# Patient Record
Sex: Female | Born: 1990 | Race: White | Hispanic: No | Marital: Single | Smoking: Never smoker
Health system: Southern US, Community
[De-identification: ages and names within clinical notes are randomized; demographics above are authoritative.]

## PROBLEM LIST (undated history)

## (undated) DIAGNOSIS — R6889 Other general symptoms and signs: Secondary | ICD-10-CM

## (undated) DIAGNOSIS — R300 Dysuria: Secondary | ICD-10-CM

## (undated) DIAGNOSIS — R102 Pelvic and perineal pain: Secondary | ICD-10-CM

## (undated) DIAGNOSIS — Z87440 Personal history of urinary (tract) infections: Secondary | ICD-10-CM

## (undated) HISTORY — DX: Other general symptoms and signs: R68.89

---
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
## 2010-04-27 LAB — POCT HEMOGLOBIN-HEMACUE: Hemoglobin: 13.9 g/dL (ref 12.0–15.0)

## 2010-09-25 ENCOUNTER — Ambulatory Visit: Payer: Self-pay | Admitting: Family

## 2010-09-30 ENCOUNTER — Ambulatory Visit (INDEPENDENT_AMBULATORY_CARE_PROVIDER_SITE_OTHER): Payer: 59 | Admitting: Family

## 2010-09-30 ENCOUNTER — Telehealth: Payer: Self-pay | Admitting: Family

## 2010-09-30 ENCOUNTER — Encounter: Payer: Self-pay | Admitting: Family

## 2010-09-30 DIAGNOSIS — R5383 Other fatigue: Secondary | ICD-10-CM | POA: Insufficient documentation

## 2010-09-30 DIAGNOSIS — R5381 Other malaise: Secondary | ICD-10-CM

## 2010-09-30 DIAGNOSIS — R635 Abnormal weight gain: Secondary | ICD-10-CM

## 2010-09-30 DIAGNOSIS — E039 Hypothyroidism, unspecified: Secondary | ICD-10-CM

## 2010-09-30 DIAGNOSIS — R41840 Attention and concentration deficit: Secondary | ICD-10-CM

## 2010-09-30 DIAGNOSIS — B079 Viral wart, unspecified: Secondary | ICD-10-CM

## 2010-09-30 LAB — CBC WITH DIFFERENTIAL/PLATELET
Basophils Absolute: 0 10*3/uL (ref 0.0–0.1)
Basophils Relative: 0 % (ref 0–1)
Eosinophils Absolute: 0.2 10*3/uL (ref 0.0–0.7)
Eosinophils Relative: 2 % (ref 0–5)
HCT: 39.1 % (ref 36.0–46.0)
Hemoglobin: 13.4 g/dL (ref 12.0–15.0)
Lymphocytes Relative: 34 % (ref 12–46)
Lymphs Abs: 2.6 10*3/uL (ref 0.7–4.0)
MCH: 29.9 pg (ref 26.0–34.0)
MCHC: 34.3 g/dL (ref 30.0–36.0)
MCV: 87.3 fL (ref 78.0–100.0)
Monocytes Absolute: 0.4 10*3/uL (ref 0.1–1.0)
Monocytes Relative: 6 % (ref 3–12)
Neutro Abs: 4.5 10*3/uL (ref 1.7–7.7)
Neutrophils Relative %: 58 % (ref 43–77)
Platelets: 224 10*3/uL (ref 150–400)
RBC: 4.48 MIL/uL (ref 3.87–5.11)
RDW: 12.6 % (ref 11.5–15.5)
WBC: 7.6 10*3/uL (ref 4.0–10.5)

## 2010-09-30 LAB — TSH: TSH: 1.551 u[IU]/mL (ref 0.350–4.500)

## 2010-09-30 NOTE — Patient Instructions (Signed)
You will be contacted about your referral for ADD evaluation. We will contact you with your blood results.  Follow up in 1-2 months.

## 2010-09-30 NOTE — Telephone Encounter (Signed)
When you enter the referral for adhd testing mother called back to state, please check with [facility] and see if the testing could be done at [facility] versus [address]

## 2010-09-30 NOTE — Assessment & Plan Note (Signed)
They wish to have formal ADD evaluation.  Will refer to psychology.

## 2010-09-30 NOTE — Assessment & Plan Note (Signed)
20 yr old female with fatigue and weight gain. Will check TSH and CBC.

## 2010-09-30 NOTE — Progress Notes (Signed)
  Subjective:    Patient ID: Tammie Boone, female    DOB: March 24, 1990, 20 y.o.   MRN: [id number]  HPI  Tammie Boone is a 20 yr old female who presents today for several concerns.   1) Poor concentration- [place] [employer]en- She has never been formally tested for ADD, although her mother notes that they have suspected it for some time. Since going to college she is having more trouble focusing.  Grades last semester were A's and B's.  Freshman year she had some C's and D's.    2)Weight gain/Fatigue,-   She has gained 20 pounds in the last 12-18 months. + Dizziness/headaches.  Has trouble falling asleep and trouble staying asleep.  On emotional roller coaster.  Periods are regular.  She does get bad cramping.  She reports that she exercises about 5 days a week.  Watches her diet carefully.    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  3) She has a wart on her left forearm that she would like to have frozen.    Review of Systems See HPI  No past medical history on file.  History   Social History  . Marital Status: Single    Spouse Name: N/A    Number of Children: N/A  . Years of Education: N/A   Occupational History  . Not on file.   Social History Main Topics  . Smoking status: Never Smoker   . Smokeless tobacco: Never Used  . Alcohol Use: Not on file  . Drug Use: Not on file  . Sexually Active: Not on file   Other Topics Concern  . Not on file   Social History Narrative  . No narrative on file    No past surgical history on file.  No family history on file.  Allergies  Allergen Reactions  . Sulfonamide Derivatives     No current outpatient prescriptions on file prior to visit.    BP 116/78  Pulse 78  Temp(Src) 97.8 F (36.6 C) (Oral)  Resp 16  Ht 5' 7.99" (1.727 m)  Wt 175 lb 1.3 oz (79.416 kg)  BMI 26.63 kg/m2  LMP 09/09/2010       Objective:   Physical Exam  Constitutional: She appears well-developed and well-nourished.    HENT:  Head: Normocephalic and atraumatic.  Neck: Normal range of motion. No thyromegaly present.  Cardiovascular: Normal rate and regular rhythm.   Pulmonary/Chest: Effort normal and breath sounds normal.  Skin: Skin is warm and dry.       Raised wart noted on left forearm which is about 3mm wide.  Psychiatric: She has a normal mood and affect. Her behavior is normal. Thought content normal.          Assessment & Plan:

## 2010-09-30 NOTE — Assessment & Plan Note (Signed)
Wart on left forearm.  Wart was frozen today using liquid nitrogen.  Pt instructed to keep the area clean and dry.

## 2010-10-01 ENCOUNTER — Encounter: Payer: Self-pay | Admitting: Family

## 2010-10-02 ENCOUNTER — Ambulatory Visit: Payer: Self-pay | Admitting: Family

## 2010-10-07 ENCOUNTER — Ambulatory Visit: Payer: 59 | Admitting: Licensed Clinical Social Worker

## 2010-10-15 ENCOUNTER — Ambulatory Visit (INDEPENDENT_AMBULATORY_CARE_PROVIDER_SITE_OTHER): Payer: 59 | Admitting: Licensed Clinical Social Worker

## 2010-10-15 DIAGNOSIS — F4321 Adjustment disorder with depressed mood: Secondary | ICD-10-CM

## 2010-10-23 ENCOUNTER — Ambulatory Visit (INDEPENDENT_AMBULATORY_CARE_PROVIDER_SITE_OTHER): Payer: 59 | Admitting: Licensed Clinical Social Worker

## 2010-10-23 DIAGNOSIS — F4321 Adjustment disorder with depressed mood: Secondary | ICD-10-CM

## 2010-10-28 ENCOUNTER — Ambulatory Visit: Payer: Self-pay | Admitting: Family

## 2010-10-30 ENCOUNTER — Encounter: Payer: Self-pay | Admitting: Family

## 2010-10-30 ENCOUNTER — Ambulatory Visit (INDEPENDENT_AMBULATORY_CARE_PROVIDER_SITE_OTHER): Payer: 59 | Admitting: Family

## 2010-10-30 VITALS — BP 98/68 | HR 72 | Temp 97.8°F | Resp 16 | Ht 67.0 in | Wt 177.0 lb

## 2010-10-30 DIAGNOSIS — F988 Other specified behavioral and emotional disorders with onset usually occurring in childhood and adolescence: Secondary | ICD-10-CM | POA: Insufficient documentation

## 2010-10-30 DIAGNOSIS — B079 Viral wart, unspecified: Secondary | ICD-10-CM

## 2010-10-30 DIAGNOSIS — Z23 Encounter for immunization: Secondary | ICD-10-CM

## 2010-10-30 MED ORDER — AMPHETAMINE-DEXTROAMPHET ER 20 MG PO CP24: 20.0000 mg | ORAL_CAPSULE | ORAL | Status: DC

## 2010-10-30 NOTE — Assessment & Plan Note (Signed)
Wart was frozen with liquid nitrogen today.

## 2010-10-30 NOTE — Assessment & Plan Note (Signed)
Will give pt a trial of Adderall today.  We discussed common side effects as well as the fact that this is a controled substance.  A controlled substance contract was signed with the patient today.  She is instructed to follow up in 1 month.

## 2010-10-30 NOTE — Progress Notes (Signed)
  Subjective:    Patient ID: Tammie Boone, female    DOB: 12-Jul-1990, 20 y.o.   MRN: [id number]  HPI Tammie Boone is a 20 yr old female who presents today for follow up.    1) ADD-  She underwent a formal ADD evalution at [facility] with Judithe Modest.  Records are reviewed and are suggestive of ADD and possible diagnosis of mild cognitive impairment. She reports ongoing trouble with her studying. She has been working hard in school and has been managing to earn B's in most of her classes. She is interested in medical therapy for her ADD.  2) Wart- (left forearm) This was frozen last visit.  Notes that it is flatter today, but not resolved.    Review of Systems See HPI  No past medical history on file.  History   Social History  . Marital Status: Single    Spouse Name: N/A    Number of Children: N/A  . Years of Education: N/A   Occupational History  . Not on file.   Social History Main Topics  . Smoking status: Never Smoker   . Smokeless tobacco: Never Used  . Alcohol Use: Not on file  . Drug Use: Not on file  . Sexually Active: Not on file   Other Topics Concern  . Not on file   Social History Narrative  . No narrative on file    No past surgical history on file.  No family history on file.  Allergies  Allergen Reactions  . Sulfonamide Derivatives     No current outpatient prescriptions on file prior to visit.    BP 98/68  Pulse 72  Temp(Src) 97.8 F (36.6 C) (Oral)  Resp 16  Ht 5\' 7"  (1.702 m)  Wt 177 lb (80.287 kg)  BMI 27.72 kg/m2       Objective:   Physical Exam  Constitutional: She appears well-developed and well-nourished. No distress.  Skin:       Wart left forearm is slightly flatter than last visit.   Psychiatric: She has a normal mood and affect. Her behavior is normal. Judgment and thought content normal.          Assessment & Plan:

## 2010-10-30 NOTE — Patient Instructions (Signed)
Please follow up in 1 month, sooner if problems or concerns.

## 2010-11-13 ENCOUNTER — Telehealth: Payer: Self-pay | Admitting: *Deleted

## 2010-11-13 NOTE — Telephone Encounter (Signed)
Please let pt know that per controlled substance contract we are unable to refill before 30 days.  I would recommend that she verify the quantity at the pharmacy next time prior to leaving to make sure that they have dispensed the proper amount.  She should also make sure that she secures her medication to make sure that nobody is taking her medication.

## 2010-11-13 NOTE — Telephone Encounter (Signed)
Pt left message stating she has been taking generic adderall x 2 weeks. Noticed this morning that she only has 3 pills left in her bottle. She called pharmacy and they told her they filled rx for a quantity of 30 and advised her to contact us. Please advise.

## 2010-11-13 NOTE — Telephone Encounter (Signed)
Pt notified and voices understanding. 

## 2010-12-02 ENCOUNTER — Ambulatory Visit (INDEPENDENT_AMBULATORY_CARE_PROVIDER_SITE_OTHER): Payer: 59 | Admitting: Family

## 2010-12-02 ENCOUNTER — Encounter: Payer: Self-pay | Admitting: Family

## 2010-12-02 DIAGNOSIS — F988 Other specified behavioral and emotional disorders with onset usually occurring in childhood and adolescence: Secondary | ICD-10-CM

## 2010-12-02 DIAGNOSIS — B079 Viral wart, unspecified: Secondary | ICD-10-CM

## 2010-12-02 MED ORDER — AMPHETAMINE-DEXTROAMPHET ER 20 MG PO CP24: 20.0000 mg | ORAL_CAPSULE | ORAL | Status: DC

## 2010-12-02 NOTE — Progress Notes (Signed)
  Subjective:    Patient ID: Tammie Boone, female    DOB: July 06, 1990, 20 y.o.   MRN: [id number]  HPI  Ms.  Stiver is a 20 yr old female who presents today for follow up of her ADD.  Last visit she was started on Adderall xr for her newly diagnosed ADD.  She was only able to complete 14 days of her adderall due to missing pills.  She was not sure if they were stolen or if the pharmacy did not dispense the proper amount.  She felt that the Adderall was helping her.  Last dose was last week.  She did not have associated side effects.    Wart- She is requesting that the wart be retreated.    Review of Systems    see HPI  No past medical history on file.  History   Social History  . Marital Status: Single    Spouse Name: N/A    Number of Children: N/A  . Years of Education: N/A   Occupational History  . Not on file.   Social History Main Topics  . Smoking status: Never Smoker   . Smokeless tobacco: Never Used  . Alcohol Use: Not on file  . Drug Use: Not on file  . Sexually Active: Not on file   Other Topics Concern  . Not on file   Social History Narrative  . No narrative on file    No past surgical history on file.  No family history on file.  Allergies  Allergen Reactions  . Sulfonamide Derivatives     No current outpatient prescriptions on file prior to visit.    BP 118/84  Pulse 66  Temp(Src) 97.8 F (36.6 C) (Oral)  Resp 16  Ht 5\' 7"  (1.702 m)  Wt 178 lb 1.3 oz (80.777 kg)  BMI 27.89 kg/m2  LMP 11/20/2010    Objective:   Physical Exam  Constitutional: She appears well-developed and well-nourished.  Skin:       Wart left forearm is slightly smaller in size.   Psychiatric: She has a normal mood and affect. Her behavior is normal. Judgment and thought content normal.          Assessment & Plan:

## 2010-12-02 NOTE — Assessment & Plan Note (Signed)
Improved while she was on the Adderall.  I reinforced with the patient the importance of keeping her medications secured.

## 2010-12-02 NOTE — Patient Instructions (Signed)
Follow up in 3 months, sooner if problems or concerns.   

## 2010-12-24 NOTE — Progress Notes (Signed)
  Subjective:    Patient ID: Tammie Boone, female    DOB: 30-Apr-1990, 20 y.o.   MRN: [id number]  HPI    Review of Systems     Objective:   Physical Exam        Assessment & Plan:  Tammie Boone was frozen on left arm using liquid nitrogen.  Pt tolerated procedure without difficulty.

## 2011-01-01 ENCOUNTER — Telehealth: Payer: Self-pay | Admitting: Family

## 2011-01-01 MED ORDER — AMPHETAMINE-DEXTROAMPHET ER 20 MG PO CP24: 20.0000 mg | ORAL_CAPSULE | ORAL | Status: DC

## 2011-01-01 NOTE — Telephone Encounter (Signed)
Message left on Amy's voicemail that Rx is at front desk for pick up and to call if any questions.

## 2011-01-01 NOTE — Telephone Encounter (Signed)
Rx printed and forwarded to Provider for signature. 

## 2011-01-01 NOTE — Telephone Encounter (Signed)
Patients mom, Amy called asking for a refill of Adderrall for patient. Amy states that she would like to come by this afternoon to pickup rx.

## 2011-01-01 NOTE — Telephone Encounter (Signed)
Rx signed.

## 2011-01-27 ENCOUNTER — Telehealth: Payer: Self-pay | Admitting: Family

## 2011-01-27 MED ORDER — AMPHETAMINE-DEXTROAMPHET ER 20 MG PO CP24: 20.0000 mg | ORAL_CAPSULE | ORAL | Status: DC

## 2011-01-27 NOTE — Telephone Encounter (Signed)
Rx printed and forwarded to Provider for signature. 

## 2011-01-27 NOTE — Telephone Encounter (Signed)
Patients mom, Amy states that patient needs a refill on adderall. Patient is going back to school this weekend and would like to pick up rx on Friday.

## 2011-01-29 NOTE — Telephone Encounter (Signed)
Left detailed message on Ann's voicemail that rx has been left at front desk for pick up and to call if any questions.

## 2011-02-02 ENCOUNTER — Encounter: Payer: Self-pay | Admitting: Gastroenterology

## 2011-02-16 ENCOUNTER — Encounter: Payer: Self-pay | Admitting: Gastroenterology

## 2011-02-16 ENCOUNTER — Ambulatory Visit (INDEPENDENT_AMBULATORY_CARE_PROVIDER_SITE_OTHER): Payer: 59 | Admitting: Gastroenterology

## 2011-02-16 VITALS — BP 104/70 | HR 70 | Ht 68.0 in | Wt 170.2 lb

## 2011-02-16 DIAGNOSIS — K921 Melena: Secondary | ICD-10-CM

## 2011-02-16 DIAGNOSIS — K5900 Constipation, unspecified: Secondary | ICD-10-CM

## 2011-02-16 MED ORDER — PEG-KCL-NACL-NASULF-NA ASC-C 100 G PO SOLR: 1.0000 | Freq: Once | ORAL | Status: DC

## 2011-02-16 NOTE — Patient Instructions (Addendum)
You have been scheduled for a Colonoscopy with propofol. See separate instructions.  Pick up your prep kit from your pharmacy.  Use Preparation H suppositories every day for rectal bleeding.

## 2011-02-16 NOTE — Progress Notes (Signed)
History of Present Illness: This is a 21 year old female here today with her mother. She has a long history of mild constipation which is controlled with MiraLax as needed. She developed bright red blood per rectum,noted on the toilet paper, several weeks ago associated with bowel movements. She has had several episodes but none in the past 2-3 days. Her father was diagnosed with stage IV colon cancer age 46 and passed away a short time later. No other family members with colon cancer. Denies weight loss, abdominal pain, constipation, diarrhea, change in stool caliber, melena, hematochezia, nausea, vomiting, dysphagia, reflux symptoms, chest pain..   Review of Systems: Pertinent positive and negative review of systems were noted in the above HPI section. All other review of systems were otherwise negative.  Current Medications, Allergies, Past Medical History, Past Surgical History, Family History and Social History were reviewed in [employer] record.  Physical Exam: General: Well developed , well nourished, no acute distress Head: Normocephalic and atraumatic Eyes:  sclerae anicteric, EOMI Ears: Normal auditory acuity Mouth: No deformity or lesions Neck: Supple, no masses or thyromegaly Lungs: Clear throughout to auscultation Heart: Regular rate and rhythm; no murmurs, rubs or bruits Abdomen: Soft, non tender and non distended. No masses, hepatosplenomegaly or hernias noted. Normal Bowel sounds Rectal: No lesions, no tenderness, Hemoccult-negative stool in the vault  Musculoskeletal: Symmetrical with no gross deformities  Skin: No lesions on visible extremities Pulses:  Normal pulses noted Extremities: No clubbing, cyanosis, edema or deformities noted Neurological: Alert oriented x 4, grossly nonfocal Cervical Nodes:  No significant cervical adenopathy Inguinal Nodes: No significant inguinal adenopathy Psychological:  Alert and cooperative. Normal mood and  affect  Assessment and Recommendations:  1. Small volume hematochezia and a family history of colon cancer in her father at age 51. I suspect she has internal hemorrhoids or another benign source of anorectal bleeding however colorectal neoplasms need to be excluded. The risks, benefits, and alternatives to colonoscopy with possible biopsy and possible polypectomy were discussed with the patient and they consent to proceed. Preparation H suppository daily for recurrent bleeding and further recommendations based on findings at colonoscopy.   2. Mild chronic constipation. Increase dietary fiber and water intake. MiraLax daily as needed.

## 2011-02-26 ENCOUNTER — Encounter: Payer: Self-pay | Admitting: Gastroenterology

## 2011-02-26 ENCOUNTER — Ambulatory Visit (AMBULATORY_SURGERY_CENTER): Payer: 59 | Admitting: Gastroenterology

## 2011-02-26 VITALS — BP 97/44 | HR 78 | Temp 98.0°F | Resp 15 | Ht 68.0 in | Wt 170.0 lb

## 2011-02-26 DIAGNOSIS — Z800 Family history of malignant neoplasm of digestive organs: Secondary | ICD-10-CM

## 2011-02-26 DIAGNOSIS — K921 Melena: Secondary | ICD-10-CM

## 2011-02-26 MED ORDER — SODIUM CHLORIDE 0.9 % IV SOLN: 500.0000 mL | INTRAVENOUS | Status: DC

## 2011-02-26 NOTE — Patient Instructions (Addendum)
FOLLOW DISCHARGE INSTRUCTIONS (BLUE & GREEN SHEETS). FOR DIET & SAFETY PRECAUTIONS.

## 2011-02-26 NOTE — Progress Notes (Signed)
Patient did not experience any of the following events: a burn prior to discharge; a fall within the facility; wrong site/side/patient/procedure/implant event; or a hospital transfer or hospital admission upon discharge from the facility. (G8907) Patient did not have preoperative order for IV antibiotic SSI prophylaxis. (G8918)  

## 2011-02-26 NOTE — Op Note (Signed)
[facility] [address] [place], [place]  [address]  COLONOSCOPY PROCEDURE REPORT  PATIENT:  Tammie, Boone  MR#:  [id number] BIRTHDATE:  1990-11-28, 20 yrs. old  GENDER:  female ENDOSCOPIST:  Judie Petit T. Russella Dar, MD, [facility] Referred by:  Alma DownsLendell Caprice, N.P. PROCEDURE DATE:  02/26/2011 PROCEDURE:  Colonoscopy 95621 ASA CLASS:  Class I INDICATIONS:  1) hematochezia  2) constipation  3) family history of colon cancer: father age 15. MEDICATIONS:   MAC sedation, administered by CRNA, propofol (Diprivan) 300 mg IV DESCRIPTION OF PROCEDURE:   After the risks benefits and alternatives of the procedure were thoroughly explained, informed consent was obtained.  Digital rectal exam was performed and revealed no abnormalities.   The LB 180AL [id number] endoscope was introduced through the anus and advanced to the cecum, which was identified by both the appendix and ileocecal valve, without limitations.  The quality of the prep was good, using MoviPrep. The instrument was then slowly withdrawn as the colon was fully examined. <<PROCEDUREIMAGES>> FINDINGS:  A normal appearing cecum, ileocecal valve, and appendiceal orifice were identified. The ascending, hepatic flexure, transverse, splenic flexure, descending, sigmoid colon, and rectum appeared unremarkable.  Retroflexed views in the rectum revealed no abnormalities.  The time to cecum =  3.5  minutes. The scope was then withdrawn (time =  11.67  min) from the patient and the procedure completed.  COMPLICATIONS:  None  ENDOSCOPIC IMPRESSION: 1) Normal colon  RECOMMENDATIONS: 1) Repeat Colonoscopy in 5 years.  Venita Lick. Russella Dar, MD, [facility]  n. eSIGNED:   Venita Lick. Endora Teresi at 02/26/2011 03:28 PM  Kathe Mariner, [phone]

## 2011-03-01 ENCOUNTER — Telehealth: Payer: Self-pay | Admitting: *Deleted

## 2011-03-01 NOTE — Telephone Encounter (Signed)
  Follow up Call-  Call back number 02/26/2011  Post procedure Call Back phone  # [phone]  Permission to leave phone message Yes      No answer,left message.

## 2011-03-04 ENCOUNTER — Telehealth: Payer: Self-pay | Admitting: Family

## 2011-03-04 MED ORDER — AMPHETAMINE-DEXTROAMPHET ER 20 MG PO CP24: 20.0000 mg | ORAL_CAPSULE | ORAL | Status: DC

## 2011-03-04 NOTE — Telephone Encounter (Signed)
Pt last seen in November and recommended follow up in February. No appt has been made with Melissa yet. Rx has been printed and forwarded to Provider for signature. Please call pt to arrange f/u.

## 2011-03-04 NOTE — Telephone Encounter (Signed)
Mom needs to pick up adderall xr 20 mg tablet

## 2011-03-05 NOTE — Telephone Encounter (Signed)
Mother called and advised rx ready to be picked up

## 2011-03-10 NOTE — Telephone Encounter (Signed)
Left message informing patient about rx and that she needs to make a follow up appt.

## 2011-04-12 ENCOUNTER — Telehealth: Payer: Self-pay | Admitting: Family

## 2011-04-12 MED ORDER — AMPHETAMINE-DEXTROAMPHET ER 20 MG PO CP24: 20.0000 mg | ORAL_CAPSULE | ORAL | Status: DC

## 2011-04-12 NOTE — Telephone Encounter (Signed)
Adderall Rx printed and forwarded to Provider for signature.

## 2011-04-12 NOTE — Telephone Encounter (Signed)
Notified pt's mom that rx is ready for pick up. Rx placed at front desk.

## 2011-04-12 NOTE — Telephone Encounter (Signed)
Patients mom amy called in stating that patient is out of adderall. She is requesting a new prescription. Amy did make an appointment for patient to come in for 05/07/11. That is the soonest time the patient could come in due to school.

## 2011-05-07 ENCOUNTER — Ambulatory Visit (INDEPENDENT_AMBULATORY_CARE_PROVIDER_SITE_OTHER): Payer: 59 | Admitting: Family

## 2011-05-07 ENCOUNTER — Encounter: Payer: Self-pay | Admitting: Family

## 2011-05-07 VITALS — BP 90/72 | HR 61 | Temp 97.9°F | Resp 16 | Ht 67.0 in | Wt 172.1 lb

## 2011-05-07 DIAGNOSIS — B079 Viral wart, unspecified: Secondary | ICD-10-CM

## 2011-05-07 DIAGNOSIS — F988 Other specified behavioral and emotional disorders with onset usually occurring in childhood and adolescence: Secondary | ICD-10-CM

## 2011-05-07 MED ORDER — AMPHETAMINE-DEXTROAMPHET ER 20 MG PO CP24: 20.0000 mg | ORAL_CAPSULE | ORAL | Status: DC

## 2011-05-07 NOTE — Assessment & Plan Note (Signed)
Well controlled.  Recommended that she continue current dose of adderall.

## 2011-05-07 NOTE — Progress Notes (Signed)
  Subjective:    Patient ID: Tammie Boone, female    DOB: [birth year], 20 y.o.   MRN: [id number]  HPI  Ms.  Boone is a 21 yr old female who presents today for follow up.  1) ADD- She reports that she feels that her ADD is very well controlled. Concentration is tood. Reports a very good semester at school with excellent test scores.  She is looking forward to going abroad to [place] next semester.  2) Wart-  Still has wart on left forearm- unchanged. Requests another freeze treatment.    Review of Systems See HPI    Objective:   Physical Exam  Constitutional: She appears well-developed and well-nourished. No distress.  Skin:          Raised wart left inner forearm.  Psychiatric: She has a normal mood and affect. Her speech is normal and behavior is normal. Judgment and thought content normal. Cognition and memory are normal.          Assessment & Plan:

## 2011-05-07 NOTE — Assessment & Plan Note (Addendum)
Unchanged. Wart was frozen today with liquid nitrogen. Pt tolerated procedure well.

## 2011-05-07 NOTE — Patient Instructions (Signed)
Follow up in 6 months, sooner if problems/concerns.  

## 2011-06-22 ENCOUNTER — Telehealth: Payer: Self-pay | Admitting: Family

## 2011-06-22 MED ORDER — AMPHETAMINE-DEXTROAMPHET ER 20 MG PO CP24: 20.0000 mg | ORAL_CAPSULE | ORAL | Status: DC

## 2011-06-22 NOTE — Telephone Encounter (Signed)
Rx signed and given to pt.

## 2011-06-22 NOTE — Telephone Encounter (Signed)
adderall xr 20 mg  

## 2011-06-22 NOTE — Telephone Encounter (Signed)
Last refill of Adderall was given 05/12/11. Rx printed and forwarded to Provider for signature.

## 2011-08-05 ENCOUNTER — Telehealth: Payer: Self-pay | Admitting: Family

## 2011-08-05 MED ORDER — AMPHETAMINE-DEXTROAMPHET ER 20 MG PO CP24: 20.0000 mg | ORAL_CAPSULE | ORAL | Status: DC

## 2011-08-05 NOTE — Telephone Encounter (Signed)
Rx last printed on 06/22/11. Due for follow up in October. Rx printed and forwarded to Provider for signature.

## 2011-08-06 NOTE — Telephone Encounter (Signed)
Left message informing patient that Rx was ready to be picked up.

## 2011-09-22 ENCOUNTER — Telehealth: Payer: Self-pay | Admitting: Family

## 2011-09-22 MED ORDER — AMPHETAMINE-DEXTROAMPHET ER 20 MG PO CP24: 20.0000 mg | ORAL_CAPSULE | ORAL | Status: DC

## 2011-09-22 NOTE — Telephone Encounter (Signed)
Signed.

## 2011-09-22 NOTE — Telephone Encounter (Signed)
Rx printed and forwarded to Provider for signature. 

## 2011-09-22 NOTE — Telephone Encounter (Signed)
Patients mother is calling to request a new prescription for adderall. She knows that Efraim Kaufmann is out of the office tomorrow and would like to know if she could write prescription before end of day today. Patients father will be at Dr. Gustavo Lah tomorrow for an appointment and would like to pick up prescription then.

## 2011-09-22 NOTE — Telephone Encounter (Signed)
Rx has been placed at front desk for pick up. Unable to reach pt or leave message at contact # listed.

## 2011-09-22 NOTE — Telephone Encounter (Signed)
Pt's mother notified.

## 2011-10-25 ENCOUNTER — Telehealth: Payer: Self-pay | Admitting: Family

## 2011-10-25 NOTE — Telephone Encounter (Signed)
Pt will be due for 6 month follow up in October. Please advise re: additonal rxs.

## 2011-10-25 NOTE — Telephone Encounter (Signed)
SHE NEEDS A 3 MONTH RX FOR THIS MED AS HER MOM WILL BE IN [place] FOR DAD TO HAVE A STEM CELL TRansplant

## 2011-10-26 MED ORDER — AMPHETAMINE-DEXTROAMPHET ER 20 MG PO CP24: 20.0000 mg | ORAL_CAPSULE | ORAL | Status: DC

## 2011-10-26 NOTE — Telephone Encounter (Signed)
Attempted to reach pt at home # and received busy tone. Left message on cell# to return my call.

## 2011-10-26 NOTE — Telephone Encounter (Signed)
30 day supply printed.  Legally we cannot provide greater than 30 days at a time of this controlled medication.  She will need follow up in October prior to additional refills please.

## 2011-10-28 NOTE — Telephone Encounter (Signed)
Pt's mother picked up written Rx. I advised her that current Provider cannot issue more than a 30 day supply of a controlled substance due to her license. Pt's mom reports that she is going to have to move to [facility] hospital during her husband's stem cell transplant for 30-90 days and will be unable to pick up Rxs on a monthly basis. She states she will talk with [facility] and they may have to transfer her care to the [place] office. Advised her to let us know what pt wishes to do.

## 2011-12-15 ENCOUNTER — Ambulatory Visit: Payer: 59 | Admitting: Family

## 2011-12-22 ENCOUNTER — Ambulatory Visit: Payer: 59 | Admitting: Family

## 2012-01-12 ENCOUNTER — Telehealth: Payer: Self-pay | Admitting: *Deleted

## 2012-01-12 MED ORDER — OSELTAMIVIR PHOSPHATE 75 MG PO CAPS: 75.0000 mg | ORAL_CAPSULE | Freq: Two times a day (BID) | ORAL | Status: DC

## 2012-01-12 NOTE — Telephone Encounter (Signed)
Rx pended below. She should take for 10 days.

## 2012-01-12 NOTE — Telephone Encounter (Signed)
Left detailed message on Amy's voicemail re: directions below and to call to verify pharmacy.

## 2012-01-12 NOTE — Telephone Encounter (Signed)
Amy returned my call requesting rx be called to [facility] @ [phone]. Rx sent via eRx, notified Amy and she will notify pt.

## 2012-01-12 NOTE — Telephone Encounter (Signed)
Pt's mom left message that pt has been exposed to the flu by her boyfriend. Pt has had the flu vaccine this year but is requesting an Rx of Tamiflu for precautionary measures. Student health told pt to contact her PMD for prophylactic treatment. Pt's mom requests that we call her without outcome and to determine what pharmacy to send rx to if approved.  Please advise.

## 2012-01-27 ENCOUNTER — Ambulatory Visit (INDEPENDENT_AMBULATORY_CARE_PROVIDER_SITE_OTHER): Payer: 59 | Admitting: Internal Medicine

## 2012-01-27 ENCOUNTER — Encounter: Payer: Self-pay | Admitting: Internal Medicine

## 2012-01-27 VITALS — BP 98/72 | HR 89 | Temp 98.2°F | Resp 16 | Wt 175.0 lb

## 2012-01-27 DIAGNOSIS — F988 Other specified behavioral and emotional disorders with onset usually occurring in childhood and adolescence: Secondary | ICD-10-CM

## 2012-01-27 MED ORDER — AMPHETAMINE-DEXTROAMPHET ER 20 MG PO CP24: 20.0000 mg | ORAL_CAPSULE | ORAL | Status: DC

## 2012-01-27 NOTE — Assessment & Plan Note (Signed)
Stable. Continue adderall at current dosing. F/u as scheduled or sooner if needed.

## 2012-01-27 NOTE — Progress Notes (Signed)
  Subjective:    Patient ID: Tammie Boone, female    DOB: Sep 18, 1990, 22 y.o.   MRN: [id number]  HPI Pt presents to clinic for follow up of ADD. Compliant with adderall without adverse effect. Denies agitation, insomnia, anxiety or palpitations. Medication does continue to help focus and concentration. No active complaint.  Past Medical History  Diagnosis Date  . Constipation   . Other abnormal clinical finding     Mega Colon (diagnosed in Middle School)   Past Surgical History  Procedure Date  . Tonsillectomy and adenoidectomy     reports that she has never smoked. She has never used smokeless tobacco. She reports that she drinks alcohol. She reports that she does not use illicit drugs. family history includes Colon cancer (age of onset:42) in her father and Kidney disease in her maternal grandfather. Allergies  Allergen Reactions  . Sulfonamide Derivatives      Review of Systems see hpi     Objective:   Physical Exam  Nursing note and vitals reviewed. Constitutional: She appears well-developed and well-nourished. No distress.  HENT:  Head: Normocephalic and atraumatic.  Eyes: Conjunctivae normal are normal. No scleral icterus.  Neurological: She is alert.  Skin: She is not diaphoretic.  Psychiatric: She has a normal mood and affect. Her behavior is normal.          Assessment & Plan:

## 2012-01-31 ENCOUNTER — Ambulatory Visit: Payer: 59 | Admitting: Family

## 2012-09-11 ENCOUNTER — Ambulatory Visit (INDEPENDENT_AMBULATORY_CARE_PROVIDER_SITE_OTHER): Payer: 59 | Admitting: Family Medicine

## 2012-09-11 ENCOUNTER — Encounter: Payer: Self-pay | Admitting: Family Medicine

## 2012-09-11 VITALS — BP 96/70 | HR 67 | Temp 98.1°F | Ht 67.0 in | Wt 168.1 lb

## 2012-09-11 DIAGNOSIS — F988 Other specified behavioral and emotional disorders with onset usually occurring in childhood and adolescence: Secondary | ICD-10-CM

## 2012-09-11 MED ORDER — AMPHETAMINE-DEXTROAMPHET ER 20 MG PO CP24: 20.0000 mg | ORAL_CAPSULE | ORAL | Status: DC

## 2012-09-11 NOTE — Patient Instructions (Addendum)
Attention Deficit Hyperactivity Disorder Attention deficit hyperactivity disorder (ADHD) is a problem with behavior issues based on the way the brain functions (neurobehavioral disorder). It is a common reason for behavior and academic problems in school. CAUSES  The cause of ADHD is unknown in most cases. It may run in families. It sometimes can be associated with learning disabilities and other behavioral problems. SYMPTOMS  There are 3 types of ADHD. The 3 types and some of the symptoms include:  Inattentive  Gets bored or distracted easily.  Loses or forgets things. Forgets to hand in homework.  Has trouble organizing or completing tasks.  Difficulty staying on task.  An inability to organize daily tasks and school work.  Leaving projects, chores, or homework unfinished.  Trouble paying attention or responding to details. Careless mistakes.  Difficulty following directions. Often seems like is not listening.  Dislikes activities that require sustained attention (like chores or homework).  Hyperactive-impulsive  Feels like it is impossible to sit still or stay in a seat. Fidgeting with hands and feet.  Trouble waiting turn.  Talking too much or out of turn. Interruptive.  Speaks or acts impulsively.  Aggressive, disruptive behavior.  Constantly busy or on the go, noisy.  Combined  Has symptoms of both of the above. Often children with ADHD feel discouraged about themselves and with school. They often perform well below their abilities in school. These symptoms can cause problems in home, school, and in relationships with peers. As children get older, the excess motor activities can calm down, but the problems with paying attention and staying organized persist. Most children do not outgrow ADHD but with good treatment can learn to cope with the symptoms. DIAGNOSIS  When ADHD is suspected, the diagnosis should be made by professionals trained in ADHD.  Diagnosis will  include:  Ruling out other reasons for the child's behavior.  The caregivers will check with the child's school and check their medical records.  They will talk to teachers and parents.  Behavior rating scales for the child will be filled out by those dealing with the child on a daily basis. A diagnosis is made only after all information has been considered. TREATMENT  Treatment usually includes behavioral treatment often along with medicines. It may include stimulant medicines. The stimulant medicines decrease impulsivity and hyperactivity and increase attention. Other medicines used include antidepressants and certain blood pressure medicines. Most experts agree that treatment for ADHD should address all aspects of the child's functioning. Treatment should not be limited to the use of medicines alone. Treatment should include structured classroom management. The parents must receive education to address rewarding good behavior, discipline, and limit-setting. Tutoring or behavioral therapy or both should be available for the child. If untreated, the disorder can have long-term serious effects into adolescence and adulthood. HOME CARE INSTRUCTIONS   Often with ADHD there is a lot of frustration among the family in dealing with the illness. There is often blame and anger that is not warranted. This is a life long illness. There is no way to prevent ADHD. In many cases, because the problem affects the family as a whole, the entire family may need help. A therapist can help the family find better ways to handle the disruptive behaviors and promote change. If the child is young, most of the therapist's work is with the parents. Parents will learn techniques for coping with and improving their child's behavior. Sometimes only the child with the ADHD needs counseling. Your caregivers can help   you make these decisions.  Children with ADHD may need help in organizing. Some helpful tips include:  Keep  routines the same every day from wake-up time to bedtime. Schedule everything. This includes homework and playtime. This should include outdoor and indoor recreation. Keep the schedule on the refrigerator or a bulletin board where it is frequently seen. Mark schedule changes as far in advance as possible.  Have a place for everything and keep everything in its place. This includes clothing, backpacks, and school supplies.  Encourage writing down assignments and bringing home needed books.  Offer your child a well-balanced diet. Breakfast is especially important for school performance. Children should avoid drinks with caffeine including:  Soft drinks.  Coffee.  Tea.  However, some older children (adolescents) may find these drinks helpful in improving their attention.  Children with ADHD need consistent rules that they can understand and follow. If rules are followed, give small rewards. Children with ADHD often receive, and expect, criticism. Look for good behavior and praise it. Set realistic goals. Give clear instructions. Look for activities that can foster success and self-esteem. Make time for pleasant activities with your child. Give lots of affection.  Parents are their children's greatest advocates. Learn as much as possible about ADHD. This helps you become a stronger and better advocate for your child. It also helps you educate your child's teachers and instructors if they feel inadequate in these areas. Parent support groups are often helpful. A national group with local chapters is called CHADD (Children and Adults with Attention Deficit Hyperactivity Disorder). PROGNOSIS  There is no cure for ADHD. Children with the disorder seldom outgrow it. Many find adaptive ways to accommodate the ADHD as they mature. SEEK MEDICAL CARE IF:  Your child has repeated muscle twitches, cough or speech outbursts.  Your child has sleep problems.  Your child has a marked loss of  appetite.  Your child develops depression.  Your child has new or worsening behavioral problems.  Your child develops dizziness.  Your child has a racing heart.  Your child has stomach pains.  Your child develops headaches. Document Released: 01/01/2002 Document Revised: 04/05/2011 Document Reviewed: 08/14/2007 [employer] Patient Information 2014 [employer].  

## 2012-09-17 NOTE — Progress Notes (Signed)
Patient ID: Tammie Boone, female   DOB: Dec 15, 1990, 22 y.o.   MRN: [id number] Cambrie Sonnenfeld [id number] [birth year] 09/17/2012      Progress Note-Follow Up  Subjective  Chief Complaint  Chief Complaint  Patient presents with  . medication refill    adderall- 3 months supply    HPI  Patient is a 22 year old female who is in today for followup on her ADD before returning to college. She reports general works well and she has no concerning side effects. She has no complaints of headache, insomnia, anxiety, chest pain, palpitations, shortness of breath, GI or GU concerns.  Past Medical History  Diagnosis Date  . Constipation   . 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 Tonsillectomy and adenoidectomy      Family History  Problem Relation Age of Onset  . Colon cancer Father 83  . Kidney disease Maternal Grandfather     History   Social History  . Marital Status: Single    Spouse Name: N/A    Number of Children: 0  . Years of Education: N/A   Occupational History  . student    Social History Main Topics  . Smoking status: Never Smoker   . Smokeless tobacco: Never Used  . Alcohol Use: Yes     Comment: occ.  . Drug Use: No  . Sexual Activity: Yes    Partners: Male   Other Topics Concern  . Not on file   Social History Narrative  . No narrative on file    Current Outpatient Prescriptions on File Prior to Visit  Medication Sig Dispense Refill  . Multiple Vitamin (MULTIVITAMIN) tablet Take 1 tablet by mouth daily.       No current facility-administered medications on file prior to visit.    Allergies  Allergen Reactions  . Sulfonamide Derivatives     Review of Systems  Review of Systems  Constitutional: Negative for fever and malaise/fatigue.  HENT: Negative for congestion.   Eyes: Negative for discharge.  Respiratory: Negative for shortness of breath.    Cardiovascular: Negative for chest pain, palpitations and leg swelling.  Gastrointestinal: Negative for nausea, abdominal pain and diarrhea.  Genitourinary: Negative for dysuria.  Musculoskeletal: Negative for falls.  Skin: Negative for rash.  Neurological: Negative for loss of consciousness and headaches.  Endo/Heme/Allergies: Negative for polydipsia.  Psychiatric/Behavioral: Negative for depression and suicidal ideas. The patient is not nervous/anxious and does not have insomnia.     Objective  BP 96/70  Pulse 67  Temp(Src) 98.1 F (36.7 C) (Oral)  Ht 5\' 7"  (1.702 m)  Wt 168 lb 1.3 oz (76.241 kg)  BMI 26.32 kg/m2  SpO2 98%  LMP 09/03/2012  Physical Exam  Physical Exam  Constitutional: She is oriented to person, place, and time and well-developed, well-nourished, and in no distress. No distress.  HENT:  Head: Normocephalic and atraumatic.  Eyes: Conjunctivae are normal.  Neck: Neck supple. No thyromegaly present.  Cardiovascular: Normal rate, regular rhythm and normal heart sounds.   No murmur heard. Pulmonary/Chest: Effort normal and breath sounds normal. She has no wheezes.  Abdominal: She exhibits no distension and no mass.  Musculoskeletal: She exhibits no edema.  Lymphadenopathy:    She has no cervical adenopathy.  Neurological: She is alert and oriented to person, place, and time.  Skin: Skin is warm and dry. No rash noted. She is not diaphoretic.  Psychiatric: Memory, affect and judgment normal.    Lab Results  Component Value Date   TSH 1.551 09/30/2010   Lab Results  Component Value Date   WBC 7.6 09/30/2010   HGB 13.4 09/30/2010   HCT 39.1 09/30/2010   MCV 87.3 09/30/2010   PLT 224 09/30/2010     Assessment & Plan  ADD (attention deficit disorder) Given 46month supply of her Adderal which is working well without concerning side effects. Is returning to college this week

## 2012-09-17 NOTE — Assessment & Plan Note (Addendum)
Given 31month supply of her Adderal which is working well without concerning side effects. Is returning to college this week

## 2013-04-03 ENCOUNTER — Ambulatory Visit (INDEPENDENT_AMBULATORY_CARE_PROVIDER_SITE_OTHER): Payer: 59 | Admitting: Family

## 2013-04-03 ENCOUNTER — Encounter: Payer: Self-pay | Admitting: Family

## 2013-04-03 ENCOUNTER — Ambulatory Visit: Payer: 59 | Admitting: Family

## 2013-04-03 VITALS — BP 108/68 | HR 99 | Temp 98.2°F | Resp 20 | Ht 67.0 in | Wt 169.1 lb

## 2013-04-03 DIAGNOSIS — F988 Other specified behavioral and emotional disorders with onset usually occurring in childhood and adolescence: Secondary | ICD-10-CM

## 2013-04-03 MED ORDER — AMPHETAMINE-DEXTROAMPHET ER 10 MG PO CP24: 10.0000 mg | ORAL_CAPSULE | Freq: Two times a day (BID) | ORAL | Status: DC

## 2013-04-03 MED ORDER — AMPHETAMINE-DEXTROAMPHETAMINE 10 MG PO TABS: 10.0000 mg | ORAL_TABLET | Freq: Two times a day (BID) | ORAL | Status: DC

## 2013-04-03 NOTE — Progress Notes (Signed)
Pre visit review using our clinic review tool, if applicable. No additional management support is needed unless otherwise documented below in the visit note. 

## 2013-04-03 NOTE — Patient Instructions (Signed)
Stop Adderall XR, start Adderall 10mg  twice daily- you can hold the second dose if not needed.   Follow up in 3 months.

## 2013-04-03 NOTE — Assessment & Plan Note (Signed)
Will change from Adderall XR 20mg  to adderall 10mg  PO BID regular release.  I am hopeful that she will only need the AM dose which will wear off early enough not to interfere with her sleep. Can use the PM dose if she has late studying exam etc.

## 2013-04-03 NOTE — Progress Notes (Signed)
   Subjective:    Patient ID: Tammie Boone, female    DOB: 05-30-1990, 23 y.o.   MRN: [id number]  HPI  Tammie Boone is a 23 yr old female who presents today for follow up of her ADD. She reports that her current dose of adderall xr 20mg  is working well for her concentration. She is in her senior year of college.  She does note that she has recently started trying to go to bed earlier and finds it difficult to sleep even though she is tired. This happens about 4 days a week. She takes Adderall at 8:30 AM.  Generally studies in the afternoon and not at night.  She has lost a few pounds since her last visit.   Wt Readings from Last 3 Encounters:  04/03/13 169 lb 2.2 oz (76.721 kg)  09/11/12 168 lb 1.3 oz (76.241 kg)  01/27/12 175 lb (79.379 kg)      Review of Systems    see HPI  Past Medical History  Diagnosis Date  . Constipation   . 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 Marital Status: Single    Spouse Name: N/A    Number of Children: 0  . Years of Education: N/A   Occupational History  . student    Social History Main Topics  . Smoking status: Never Smoker   . Smokeless tobacco: Never Used  . Alcohol Use: Yes     Comment: occ.  . Drug Use: No  . Sexual Activity: Yes    Partners: Male   Other Topics Concern  . Not on file   Social History Narrative  . No narrative on file    Past Surgical History  Procedure Laterality Date  . Tonsillectomy and adenoidectomy      Family History  Problem Relation Age of Onset  . Colon cancer Father [id number]  . Kidney disease Maternal Grandfather     Allergies  Allergen Reactions  . Sulfonamide Derivatives     Current Outpatient Prescriptions on File Prior to Visit  Medication Sig Dispense Refill  . levonorgestrel-ethinyl estradiol (AVIANE,ALESSE,LESSINA) 0.1-20 MG-MCG tablet Take 1 tablet by mouth daily.      . Multiple Vitamin (MULTIVITAMIN) tablet Take  1 tablet by mouth daily.       No current facility-administered medications on file prior to visit.    BP 108/68  Pulse 99  Temp(Src) 98.2 F (36.8 C) (Oral)  Resp 20  Ht 5\' 7"  (1.702 m)  Wt 169 lb 2.2 oz (76.721 kg)  BMI 26.48 kg/m2  SpO2 99%  LMP 03/20/2013    Objective:   Physical Exam  Constitutional: She is oriented to person, place, and time. She appears well-developed and well-nourished. No distress.  Neurological: She is alert and oriented to person, place, and time.  Psychiatric: She has a normal mood and affect. Her behavior is normal. Judgment and thought content normal.          Assessment & Plan:

## 2013-04-12 ENCOUNTER — Ambulatory Visit: Payer: 59 | Admitting: Family Medicine

## 2013-09-17 ENCOUNTER — Telehealth: Payer: Self-pay

## 2013-09-17 MED ORDER — AMPHETAMINE-DEXTROAMPHETAMINE 10 MG PO TABS: 10.0000 mg | ORAL_TABLET | Freq: Two times a day (BID) | ORAL | Status: DC

## 2013-09-17 NOTE — Telephone Encounter (Signed)
Pt stated that "she switched Adderall and wanted to know if she needs to schedule appt before she can get a refill."LDM

## 2013-09-17 NOTE — Telephone Encounter (Signed)
She needs an appt for 2 reasons one is they changed her Adderall dosing but also it has been about 6 months. She can have just 14 tabs of the Adderall 10 mg tabs 1 po bid to hold her til seen

## 2013-09-17 NOTE — Telephone Encounter (Signed)
Please inform pt of Dr Abner Greenspan message below.  RX at front desk

## 2013-09-18 NOTE — Telephone Encounter (Signed)
Left message for patient to return my call.

## 2013-09-20 NOTE — Telephone Encounter (Signed)
Left message for patient to return my call.

## 2013-09-21 NOTE — Telephone Encounter (Signed)
Left message for patient to return my call.

## 2013-09-24 NOTE — Telephone Encounter (Signed)
Letter mailed to pt.  

## 2013-10-29 ENCOUNTER — Encounter: Payer: Self-pay | Admitting: Family Medicine

## 2013-10-29 ENCOUNTER — Ambulatory Visit (INDEPENDENT_AMBULATORY_CARE_PROVIDER_SITE_OTHER): Payer: 59 | Admitting: Family Medicine

## 2013-10-29 VITALS — BP 96/56 | HR 75 | Temp 98.6°F | Ht 67.0 in | Wt 173.4 lb

## 2013-10-29 DIAGNOSIS — F909 Attention-deficit hyperactivity disorder, unspecified type: Secondary | ICD-10-CM

## 2013-10-29 DIAGNOSIS — N390 Urinary tract infection, site not specified: Secondary | ICD-10-CM

## 2013-10-29 DIAGNOSIS — F988 Other specified behavioral and emotional disorders with onset usually occurring in childhood and adolescence: Secondary | ICD-10-CM

## 2013-10-29 MED ORDER — AMPHETAMINE-DEXTROAMPHETAMINE 10 MG PO TABS: 10.0000 mg | ORAL_TABLET | Freq: Two times a day (BID) | ORAL | Status: DC

## 2013-10-29 NOTE — Progress Notes (Signed)
Pre visit review using our clinic review tool, if applicable. No additional management support is needed unless otherwise documented below in the visit note. 

## 2013-10-29 NOTE — Patient Instructions (Addendum)
Needs 6 month appt to be a new patient in [place] with one of the providers she has moved to [place]. Dr B  [facility]   Attention Deficit Hyperactivity Disorder Attention deficit hyperactivity disorder (ADHD) is a problem with behavior issues based on the way the brain functions (neurobehavioral disorder). It is a common reason for behavior and academic problems in school. SYMPTOMS  There are 3 types of ADHD. The 3 types and some of the symptoms include:  Inattentive.  Gets bored or distracted easily.  Loses or forgets things. Forgets to hand in homework.  Has trouble organizing or completing tasks.  Difficulty staying on task.  An inability to organize daily tasks and school work.  Leaving projects, chores, or homework unfinished.  Trouble paying attention or responding to details. Careless mistakes.  Difficulty following directions. Often seems like is not listening.  Dislikes activities that require sustained attention (like chores or homework).  Hyperactive-impulsive.  Feels like it is impossible to sit still or stay in a seat. Fidgeting with hands and feet.  Trouble waiting turn.  Talking too much or out of turn. Interruptive.  Speaks or acts impulsively.  Aggressive, disruptive behavior.  Constantly busy or on the go; noisy.  Often leaves seat when they are expected to remain seated.  Often runs or climbs where it is not appropriate, or feels very restless.  Combined.  Has symptoms of both of the above. Often children with ADHD feel discouraged about themselves and with school. They often perform well below their abilities in school. As children get older, the excess motor activities can calm down, but the problems with paying attention and staying organized persist. Most children do not outgrow ADHD but with good treatment can learn to cope with the symptoms. DIAGNOSIS  When ADHD is suspected, the diagnosis  should be made by professionals trained in ADHD. This professional will collect information about the individual suspected of having ADHD. Information must be collected from various settings where the person lives, works, or attends school.  Diagnosis will include:  Confirming symptoms began in childhood.  Ruling out other reasons for the child's behavior.  The health care providers will check with the child's school and check their medical records.  They will talk to teachers and parents.  Behavior rating scales for the child will be filled out by those dealing with the child on a daily basis. A diagnosis is made only after all information has been considered. TREATMENT  Treatment usually includes behavioral treatment, tutoring or extra support in school, and stimulant medicines. Because of the way a person's brain works with ADHD, these medicines decrease impulsivity and hyperactivity and increase attention. This is different than how they would work in a person who does not have ADHD. Other medicines used include antidepressants and certain blood pressure medicines. Most experts agree that treatment for ADHD should address all aspects of the person's functioning. Along with medicines, treatment should include structured classroom management at school. Parents should reward good behavior, provide constant discipline, and set limits. Tutoring should be available for the child as needed. ADHD is a lifelong condition. If untreated, the disorder can have long-term serious effects into adolescence and adulthood. HOME CARE INSTRUCTIONS   Often with ADHD there is a lot of frustration among family members dealing with the condition. Blame and anger are also feelings that are common. In many cases, because the problem affects the family as a whole, the entire family may need  help. A therapist can help the family find better ways to handle the disruptive behaviors of the person with ADHD and promote  change. If the person with ADHD is young, most of the therapist's work is with the parents. Parents will learn techniques for coping with and improving their child's behavior. Sometimes only the child with the ADHD needs counseling. Your health care providers can help you make these decisions.  Children with ADHD may need help learning how to organize. Some helpful tips include:  Keep routines the same every day from wake-up time to bedtime. Schedule all activities, including homework and playtime. Keep the schedule in a place where the person with ADHD will often see it. Mark schedule changes as far in advance as possible.  Schedule outdoor and indoor recreation.  Have a place for everything and keep everything in its place. This includes clothing, backpacks, and school supplies.  Encourage writing down assignments and bringing home needed books. Work with your child's teachers for assistance in organizing school work.  Offer your child a well-balanced diet. Breakfast that includes a balance of whole grains, protein, and fruits or vegetables is especially important for school performance. Children should avoid drinks with caffeine including:  Soft drinks.  Coffee.  Tea.  However, some older children (adolescents) may find these drinks helpful in improving their attention. Because it can also be common for adolescents with ADHD to become addicted to caffeine, talk with your health care provider about what is a safe amount of caffeine intake for your child.  Children with ADHD need consistent rules that they can understand and follow. If rules are followed, give small rewards. Children with ADHD often receive, and expect, criticism. Look for good behavior and praise it. Set realistic goals. Give clear instructions. Look for activities that can foster success and self-esteem. Make time for pleasant activities with your child. Give lots of affection.  Parents are their children's greatest  advocates. Learn as much as possible about ADHD. This helps you become a stronger and better advocate for your child. It also helps you educate your child's teachers and instructors if they feel inadequate in these areas. Parent support groups are often helpful. A national group with local chapters is called Children and Adults with Attention Deficit Hyperactivity Disorder (CHADD). SEEK MEDICAL CARE IF:  Your child has repeated muscle twitches, cough, or speech outbursts.  Your child has sleep problems.  Your child has a marked loss of appetite.  Your child develops depression.  Your child has new or worsening behavioral problems.  Your child develops dizziness.  Your child has a racing heart.  Your child has stomach pains.  Your child develops headaches. SEEK IMMEDIATE MEDICAL CARE IF:  Your child has been diagnosed with depression or anxiety and the symptoms seem to be getting worse.  Your child has been depressed and suddenly appears to have increased energy or motivation.  You are worried that your child is having a bad reaction to a medication he or she is taking for ADHD. Document Released: 01/01/2002 Document Revised: 01/16/2013 Document Reviewed: 09/18/2012 [facility] Patient Information 2015 [place], [place]. This information is not intended to replace advice given to you by your health care provider. Make sure you discuss any questions you have with your health care provider.

## 2013-10-29 NOTE — Progress Notes (Signed)
Patient ID: Tammie Boone, female   DOB: November 18, 1990, 23 y.o.   MRN: [id number] Tammie Boone [id number] 1990-12-14 10/29/2013      Progress Note-Follow Up  Subjective  Chief Complaint  Chief Complaint  Patient presents with  . Follow-up    adderall    HPI  Patient is a 23 year old female in today for routine medical care. She is still in school at [employer]. Doing well. No recent illness or concerns. No HA/palpitations/insomnia/GI concerns. Denies CP/palp/SOB/HA/congestion/fevers/GI or GU c/o. Taking meds as prescribed  Past Medical History  Diagnosis Date  . Constipation   . 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 Tonsillectomy and adenoidectomy      Family History  Problem Relation Age of Onset  . Colon cancer Father 48  . Kidney disease Maternal Grandfather     History   Social History  . Marital Status: Single    Spouse Name: N/A    Number of Children: 0  . Years of Education: N/A   Occupational History  . student    Social History Main Topics  . Smoking status: Never Smoker   . Smokeless tobacco: Never Used  . Alcohol Use: Yes     Comment: occ.  . Drug Use: No  . Sexual Activity: Yes    Partners: Male   Other Topics Concern  . Not on file   Social History Narrative  . No narrative on file    Current Outpatient Prescriptions on File Prior to Visit  Medication Sig Dispense Refill  . levonorgestrel-ethinyl estradiol (AVIANE,ALESSE,LESSINA) 0.1-20 MG-MCG tablet Take 1 tablet by mouth daily.      . Multiple Vitamin (MULTIVITAMIN) tablet Take 1 tablet by mouth daily.       No current facility-administered medications on file prior to visit.    Allergies  Allergen Reactions  . Sulfonamide Derivatives     Review of Systems  Review of Systems  Constitutional: Negative for fever and malaise/fatigue.  HENT: Negative for congestion.   Eyes: Negative for  discharge.  Respiratory: Negative for shortness of breath.   Cardiovascular: Negative for chest pain, palpitations and leg swelling.  Gastrointestinal: Negative for nausea, abdominal pain and diarrhea.  Genitourinary: Negative for dysuria.  Musculoskeletal: Negative for falls.  Skin: Negative for rash.  Neurological: Negative for loss of consciousness and headaches.  Endo/Heme/Allergies: Negative for polydipsia.  Psychiatric/Behavioral: Negative for depression and suicidal ideas. The patient is not nervous/anxious and does not have insomnia.     Objective  BP 96/56  Pulse 75  Temp(Src) 98.6 F (37 C) (Oral)  Ht 5\' 7"  (1.702 m)  Wt 173 lb 6.4 oz (78.654 kg)  BMI 27.15 kg/m2  SpO2 100%  LMP 10/11/2013  Physical Exam  Physical Exam  Constitutional: She is oriented to person, place, and time and well-developed, well-nourished, and in no distress. No distress.  HENT:  Head: Normocephalic and atraumatic.  Eyes: Conjunctivae are normal.  Neck: Neck supple. No thyromegaly present.  Cardiovascular: Normal rate, regular rhythm and normal heart sounds.   No murmur heard. Pulmonary/Chest: Effort normal and breath sounds normal. She has no wheezes.  Abdominal: She exhibits no distension and no mass.  Musculoskeletal: She exhibits no edema.  Lymphadenopathy:    She has no cervical adenopathy.  Neurological: She is alert and oriented to person, place, and time.  Skin: Skin is warm and dry. No rash noted.  She is not diaphoretic.  Psychiatric: Memory, affect and judgment normal.    Lab Results  Component Value Date   TSH 1.551 09/30/2010   Lab Results  Component Value Date   WBC 7.6 09/30/2010   HGB 13.4 09/30/2010   HCT 39.1 09/30/2010   MCV 87.3 09/30/2010   PLT 224 09/30/2010     Assessment & Plan  Recurrent UTI Doing better on daily Macrobid. Encouraged daily probiotics.  ADD (attention deficit disorder) In today for refills today, no concerns identified

## 2013-11-04 ENCOUNTER — Encounter: Payer: Self-pay | Admitting: Family Medicine

## 2013-11-04 DIAGNOSIS — N390 Urinary tract infection, site not specified: Secondary | ICD-10-CM | POA: Insufficient documentation

## 2013-11-04 NOTE — Assessment & Plan Note (Signed)
In today for refills today, no concerns identified

## 2013-11-04 NOTE — Assessment & Plan Note (Signed)
Doing better on daily Macrobid. Encouraged daily probiotics.

## 2014-03-29 ENCOUNTER — Ambulatory Visit: Payer: 59 | Admitting: Family

## 2014-04-02 ENCOUNTER — Telehealth: Payer: Self-pay | Admitting: Family

## 2014-04-02 ENCOUNTER — Other Ambulatory Visit: Payer: Self-pay | Admitting: Family Medicine

## 2014-04-02 ENCOUNTER — Ambulatory Visit (INDEPENDENT_AMBULATORY_CARE_PROVIDER_SITE_OTHER): Payer: 59 | Admitting: Family

## 2014-04-02 ENCOUNTER — Encounter: Payer: Self-pay | Admitting: Family

## 2014-04-02 VITALS — BP 90/70 | HR 64 | Temp 97.8°F | Resp 16 | Ht 67.0 in | Wt 174.2 lb

## 2014-04-02 DIAGNOSIS — F988 Other specified behavioral and emotional disorders with onset usually occurring in childhood and adolescence: Secondary | ICD-10-CM

## 2014-04-02 DIAGNOSIS — F909 Attention-deficit hyperactivity disorder, unspecified type: Secondary | ICD-10-CM

## 2014-04-02 MED ORDER — AMPHETAMINE-DEXTROAMPHETAMINE 10 MG PO TABS: 10.0000 mg | ORAL_TABLET | Freq: Two times a day (BID) | ORAL | Status: DC

## 2014-04-02 NOTE — Progress Notes (Signed)
Pre visit review using our clinic review tool, if applicable. No additional management support is needed unless otherwise documented below in the visit note. 

## 2014-04-02 NOTE — Telephone Encounter (Signed)
I wrote April and may prescriptions and left on Tricia's desk.

## 2014-04-02 NOTE — Telephone Encounter (Signed)
I gave pt a 1 month supply of adderall. She would like to pick up second and third month printed on Thursday. Are you comfortable providing please?

## 2014-04-02 NOTE — Patient Instructions (Addendum)
Please schedule a physical at the front desk. We will contact you re: your adderall refills.

## 2014-04-02 NOTE — Telephone Encounter (Signed)
Dr Blyth please advise. 

## 2014-04-02 NOTE — Progress Notes (Signed)
   Subjective:    Patient ID: Tammie Boone, female    DOB: July 15, 1990, 24 y.o.   MRN: [id number]  HPI  Tammie Boone is a 24 yr old female who presents today for follow up of ADHD. She has finished school and is now working as a Water quality scientistphlebotomist, wants to do radiation therapy.  Reports that adderall is working well for her and she is able to focus through the work day.  Wt Readings from Last 3 Encounters:  04/02/14 174 lb 3.2 oz (79.017 kg)  10/29/13 173 lb 6.4 oz (78.654 kg)  04/03/13 169 lb 2.2 oz (76.721 kg)    Review of Systems See HPI  Past Medical History  Diagnosis Date  . Constipation   . 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 Recurrent UTI 11/04/2013    History   Social History  . Marital Status: Single    Spouse Name: N/A  . Number of Children: 0  . Years of Education: N/A   Occupational History  . student    Social History Main Topics  . Smoking status: Never Smoker   . Smokeless tobacco: Never Used  . Alcohol Use: Yes     Comment: occ.  . Drug Use: No  . Sexual Activity:    Partners: Male   Other Topics Concern  . Not on file   Social History Narrative    Past Surgical History  Procedure Laterality Date  . Tonsillectomy and adenoidectomy      Family History  Problem Relation Age of Onset  . Colon cancer Father [id number]  . Kidney disease Maternal Grandfather     Allergies  Allergen Reactions  . Sulfonamide Derivatives     Current Outpatient Prescriptions on File Prior to Visit  Medication Sig Dispense Refill  . amphetamine-dextroamphetamine (ADDERALL) 10 MG tablet Take 1 tablet (10 mg total) by mouth 2 (two) times daily. RX for 12-26-13 60 tablet 0  . levonorgestrel-ethinyl estradiol (AVIANE,ALESSE,LESSINA) 0.1-20 MG-MCG tablet Take 1 tablet by mouth daily.    . Multiple Vitamin (MULTIVITAMIN) tablet Take 1 tablet by mouth daily.     No current facility-administered medications on file prior to visit.    BP  90/70 mmHg  Pulse 64  Temp(Src) 97.8 F (36.6 C) (Oral)  Resp 16  Ht 5\' 7"  (1.702 m)  Wt 174 lb 3.2 oz (79.017 kg)  BMI 27.28 kg/m2  SpO2 99%  LMP 03/24/2014       Objective:   Physical Exam  Constitutional: She appears well-developed and well-nourished.  Cardiovascular: Normal rate, regular rhythm and normal heart sounds.   No murmur heard. Pulmonary/Chest: Effort normal and breath sounds normal. No respiratory distress. She has no wheezes.  Psychiatric: She has a normal mood and affect. Her behavior is normal. Judgment and thought content normal.          Assessment & Plan:

## 2014-04-03 ENCOUNTER — Telehealth: Payer: Self-pay | Admitting: Family

## 2014-04-03 NOTE — Telephone Encounter (Signed)
Thanks

## 2014-04-03 NOTE — Telephone Encounter (Signed)
Dr Abner GreenspanBlyth left April and May rxs on my desk so I think that will give her 3 months worth.

## 2014-04-03 NOTE — Telephone Encounter (Signed)
Rxs placed at front desk for pick up and left message on cell# to return my call.

## 2014-04-03 NOTE — Telephone Encounter (Signed)
Please let pt know that Dr. Abner GreenspanBlyth is out of the office and will not return until Monday.  I will ask her on Monday if she will write the second and third months of her adderall.

## 2014-04-04 NOTE — Assessment & Plan Note (Signed)
Stable on adderall, continue same.  

## 2014-05-23 ENCOUNTER — Ambulatory Visit: Payer: 59 | Admitting: Family Medicine

## 2014-05-23 DIAGNOSIS — Z0289 Encounter for other administrative examinations: Secondary | ICD-10-CM

## 2014-06-05 ENCOUNTER — Other Ambulatory Visit (HOSPITAL_COMMUNITY)
Admission: RE | Admit: 2014-06-05 | Discharge: 2014-06-05 | Disposition: A | Discharge status: Home / Self Care | Payer: 59 | Source: Ambulatory Visit | Attending: Obstetrics and Gynecology | Admitting: Obstetrics and Gynecology

## 2014-06-05 ENCOUNTER — Other Ambulatory Visit: Payer: Self-pay | Admitting: Obstetrics & Gynecology

## 2014-06-05 DIAGNOSIS — Z01419 Encounter for gynecological examination (general) (routine) without abnormal findings: Secondary | ICD-10-CM | POA: Insufficient documentation

## 2014-06-06 LAB — CYTOLOGY - PAP

## 2015-01-21 ENCOUNTER — Telehealth: Payer: Self-pay | Admitting: Family Medicine

## 2015-01-21 NOTE — Telephone Encounter (Signed)
Pt never picked up rx for Adderrall 10 mg from April 02, 2014-(shred-it)

## 2015-02-07 ENCOUNTER — Telehealth: Payer: 59 | Admitting: Family

## 2015-02-07 DIAGNOSIS — R05 Cough: Secondary | ICD-10-CM

## 2015-02-07 DIAGNOSIS — R059 Cough, unspecified: Secondary | ICD-10-CM

## 2015-02-07 MED ORDER — BENZONATATE 100 MG PO CAPS: 100.0000 mg | ORAL_CAPSULE | Freq: Three times a day (TID) | ORAL | Status: DC | PRN

## 2015-02-07 NOTE — Progress Notes (Signed)
We are sorry that you are not feeling well.  Here is how we plan to help!  Based on what you have shared with me it looks like you have upper respiratory tract inflammation that has resulted in a significant cough.  Inflammation and infection in the upper respiratory tract is commonly called bronchitis and has four common causes:  Allergies, Viral Infections, Acid Reflux and Bacterial Infections.  Allergies, viruses and acid reflux are treated by controlling symptoms or eliminating the cause. An example might be a cough caused by taking certain blood pressure medications. You stop the cough by changing the medication. Another example might be a cough caused by acid reflux. Controlling the reflux helps control the cough.  Based on your presentation I believe you most likely have A cough due to a virus.  This is called viral bronchitis and is best treated by rest, plenty of fluids and control of the cough.  You may use Ibuprofen or Tylenol as directed to help your symptoms.    In addition you may use A non-prescription cough medication called Mucinex DM: take 2 tablets every 12 hours. and A prescription cough medication called Tessalon Perles 100mg. You may take 1-2 capsules every 8 hours as needed for your cough.    HOME CARE . Only take medications as instructed by your medical team. . Complete the entire course of an antibiotic. . Drink plenty of fluids and get plenty of rest. . Avoid close contacts especially the very young and the elderly . Cover your mouth if you cough or cough into your sleeve. . Always remember to wash your hands . A steam or ultrasonic humidifier can help congestion.    GET HELP RIGHT AWAY IF: . You develop worsening fever. . You become short of breath . You cough up blood. . Your symptoms persist after you have completed your treatment plan MAKE SURE YOU   Understand these instructions.  Will watch your condition.  Will get help right away if you are not doing  well or get worse.  Your e-visit answers were reviewed by a board certified advanced clinical practitioner to complete your personal care plan.  Depending on the condition, your plan could have included both over the counter or prescription medications. If there is a problem please reply  once you have received a response from your provider. Your safety is important to us.  If you have drug allergies check your prescription carefully.    You can use MyChart to ask questions about today's visit, request a non-urgent call back, or ask for a work or school excuse for 24 hours related to this e-Visit. If it has been greater than 24 hours you will need to follow up with your provider, or enter a new e-Visit to address those concerns. You will get an e-mail in the next two days asking about your experience.  I hope that your e-visit has been valuable and will speed your recovery. Thank you for using e-visits.   

## 2015-04-06 DIAGNOSIS — L299 Pruritus, unspecified: Secondary | ICD-10-CM | POA: Diagnosis not present

## 2015-04-06 DIAGNOSIS — L239 Allergic contact dermatitis, unspecified cause: Secondary | ICD-10-CM | POA: Diagnosis not present

## 2015-05-03 ENCOUNTER — Telehealth: Payer: 59 | Admitting: Physician Assistant

## 2015-05-03 DIAGNOSIS — J329 Chronic sinusitis, unspecified: Secondary | ICD-10-CM | POA: Diagnosis not present

## 2015-05-03 DIAGNOSIS — B9789 Other viral agents as the cause of diseases classified elsewhere: Secondary | ICD-10-CM

## 2015-05-03 DIAGNOSIS — B349 Viral infection, unspecified: Secondary | ICD-10-CM

## 2015-05-03 MED ORDER — FLUTICASONE PROPIONATE 50 MCG/ACT NA SUSP: 2.0000 | Freq: Every day | NASAL | Status: DC

## 2015-05-03 MED ORDER — BENZONATATE 100 MG PO CAPS: 100.0000 mg | ORAL_CAPSULE | Freq: Three times a day (TID) | ORAL | Status: DC | PRN

## 2015-05-03 NOTE — Progress Notes (Signed)
We are sorry that you are not feeling well.  Here is how we plan to help!  Based on what you have shared with me it looks like you have sinusitis.  Sinusitis is inflammation and infection in the sinus cavities of the head.  Based on your presentation I believe you most likely have Acute Viral Sinusitis.This is an infection most likely caused by a virus. There is not specific treatment for viral sinusitis other than to help you with the symptoms until the infection runs its course.  You may use an oral decongestant such as Mucinex D or if you have glaucoma or high blood pressure use plain Mucinex. Saline nasal spray help and can safely be used as often as needed for congestion, I have prescribed: Fluticasone nasal spray two sprays in each nostril twice a day. I have also sent in a prescription for Tessalon for your cough to take as directed.   Some authorities believe that zinc sprays or the use of Echinacea may shorten the course of your symptoms.  Sinus infections are not as easily transmitted as other respiratory infection, however we still recommend that you avoid close contact with loved ones, especially the very young and elderly.  Remember to wash your hands thoroughly throughout the day as this is the number one way to prevent the spread of infection!  Home Care:  Only take medications as instructed by your medical team.  Complete the entire course of an antibiotic.  Do not take these medications with alcohol.  A steam or ultrasonic humidifier can help congestion.  You can place a towel over your head and breathe in the steam from hot water coming from a faucet.  Avoid close contacts especially the very young and the elderly.  Cover your mouth when you cough or sneeze.  Always remember to wash your hands.  Get Help Right Away If:  You develop worsening fever or sinus pain.  You develop a severe head ache or visual changes.  Your symptoms persist after you have completed your  treatment plan.  Make sure you  Understand these instructions.  Will watch your condition.  Will get help right away if you are not doing well or get worse.  Your e-visit answers were reviewed by a board certified advanced clinical practitioner to complete your personal care plan.  Depending on the condition, your plan could have included both over the counter or prescription medications.  If there is a problem please reply  once you have received a response from your provider.  Your safety is important to us.  If you have drug allergies check your prescription carefully.    You can use MyChart to ask questions about today's visit, request a non-urgent call back, or ask for a work or school excuse for 24 hours related to this e-Visit. If it has been greater than 24 hours you will need to follow up with your provider, or enter a new e-Visit to address those concerns.  You will get an e-mail in the next two days asking about your experience.  I hope that your e-visit has been valuable and will speed your recovery. Thank you for using e-visits.

## 2015-07-09 ENCOUNTER — Ambulatory Visit (HOSPITAL_COMMUNITY)
Admission: RE | Admit: 2015-07-09 | Discharge: 2015-07-09 | Disposition: A | Discharge status: Home / Self Care | Payer: 59 | Source: Ambulatory Visit | Attending: Sports Medicine | Admitting: Sports Medicine

## 2015-07-09 ENCOUNTER — Other Ambulatory Visit (HOSPITAL_COMMUNITY): Payer: Self-pay | Admitting: Sports Medicine

## 2015-07-09 DIAGNOSIS — M25474 Effusion, right foot: Secondary | ICD-10-CM | POA: Diagnosis not present

## 2015-07-09 DIAGNOSIS — M79671 Pain in right foot: Secondary | ICD-10-CM

## 2015-07-09 DIAGNOSIS — M25571 Pain in right ankle and joints of right foot: Secondary | ICD-10-CM | POA: Diagnosis not present

## 2015-07-09 DIAGNOSIS — R600 Localized edema: Secondary | ICD-10-CM | POA: Diagnosis not present

## 2015-08-29 ENCOUNTER — Encounter: Payer: 59 | Admitting: Neurology

## 2015-08-29 ENCOUNTER — Encounter: Payer: 59 | Admitting: Family Medicine

## 2015-09-02 DIAGNOSIS — J029 Acute pharyngitis, unspecified: Secondary | ICD-10-CM | POA: Diagnosis not present

## 2015-09-02 DIAGNOSIS — J00 Acute nasopharyngitis [common cold]: Secondary | ICD-10-CM | POA: Diagnosis not present

## 2015-09-07 DIAGNOSIS — J0110 Acute frontal sinusitis, unspecified: Secondary | ICD-10-CM | POA: Diagnosis not present

## 2015-09-12 ENCOUNTER — Ambulatory Visit: Payer: 59 | Admitting: Family

## 2015-09-12 DIAGNOSIS — Z0289 Encounter for other administrative examinations: Secondary | ICD-10-CM

## 2015-09-16 ENCOUNTER — Encounter: Payer: Self-pay | Admitting: Family Medicine

## 2015-09-24 ENCOUNTER — Telehealth: Payer: Self-pay | Admitting: Family Medicine

## 2015-09-24 NOTE — Telephone Encounter (Signed)
I am willing to do a Friday morning at 7:15 or 7:30 in the second half of September. I cannot do [facility] this week [facility] and have a conference next weekr

## 2015-09-24 NOTE — Telephone Encounter (Signed)
Pt's mom called in to schedule pt's CPE. Informed her of providers next available. She states that it is to far out. She says that pt needs it soon if possible. She says that pt lives and drives from [place] so she would like to know if provider could accommodate . She says that pt is available on Monday, Thursday and Friday morning.    Could provider please advise for scheduling?

## 2015-09-25 NOTE — Telephone Encounter (Signed)
Willing to stay late on Friday 9/29 or 9/30 whichever is Friday. Can put her in at 4:30

## 2015-09-25 NOTE — Telephone Encounter (Signed)
Pt's mom feels that 7:15 or 7:30 is to early for pt to travel considering that she lives in [place].    Is there a later time that we could try? I will make pt/ pt's mom aware for scheduling.

## 2015-10-01 NOTE — Telephone Encounter (Signed)
Called, spoke with pt's mom. Informed her of providers message below. She says that she will check w/ her daughter to see what works for her. She will call our office back to schedule.

## 2015-10-01 NOTE — Telephone Encounter (Signed)
Pt's mom called back in. She would like to know if she could go with 08/16/15 at 7:30 for pt's CPE?   I informed her that I would schedule. I will call pt back if appt date and time doesn't work for PCP.    Please advise?

## 2015-10-02 NOTE — Telephone Encounter (Signed)
Yes, 9/22. I will confirm for 7:15a with pt.

## 2015-10-02 NOTE — Telephone Encounter (Signed)
That will work if you mean 9/22 not 7/22 but we should say 7:15 if it is an annual

## 2015-10-17 ENCOUNTER — Ambulatory Visit: Payer: 59 | Admitting: Family Medicine

## 2015-10-17 DIAGNOSIS — Z0289 Encounter for other administrative examinations: Secondary | ICD-10-CM

## 2015-10-20 ENCOUNTER — Encounter: Payer: Self-pay | Admitting: Family Medicine

## 2015-11-05 ENCOUNTER — Ambulatory Visit (INDEPENDENT_AMBULATORY_CARE_PROVIDER_SITE_OTHER): Payer: Self-pay | Admitting: Neurology

## 2015-11-05 ENCOUNTER — Ambulatory Visit (INDEPENDENT_AMBULATORY_CARE_PROVIDER_SITE_OTHER): Payer: 59 | Admitting: Neurology

## 2015-11-05 ENCOUNTER — Encounter: Payer: Self-pay | Admitting: Neurology

## 2015-11-05 DIAGNOSIS — M79671 Pain in right foot: Secondary | ICD-10-CM

## 2015-11-05 DIAGNOSIS — R5383 Other fatigue: Secondary | ICD-10-CM

## 2015-11-05 NOTE — Progress Notes (Signed)
Please refer to EMG and nerve conduction study procedure note. 

## 2015-11-05 NOTE — Procedures (Signed)
     HISTORY:  Tammie MarinerLindsey Sparkman is a 25 year old patient with a one-year history of right foot pain that is most noticeable at nighttime when she is off of her feet. She denies any back pain or pain down the leg, she reports no weakness of the leg. She is being evaluated for a possible neuropathy or a radiculopathy.  NERVE CONDUCTION STUDIES:  Nerve conduction studies were performed on both lower extremities. The distal motor latencies and motor amplitudes for the peroneal and posterior tibial nerves were within normal limits. The nerve conduction velocities for these nerves were also normal. The H reflex latencies were normal. The sensory latencies for the peroneal nerves were within normal limits. The medial and lateral plantar sensory latencies were within normal limits and symmetric.   EMG STUDIES:  EMG study was performed on the right lower extremity:  The tibialis anterior muscle reveals 2 to 4K motor units with full recruitment. No fibrillations or positive waves were seen. The peroneus tertius muscle reveals 2 to 4K motor units with full recruitment. No fibrillations or positive waves were seen. The medial gastrocnemius muscle reveals 1 to 3K motor units with full recruitment. No fibrillations or positive waves were seen. The abductor hallucis muscle reveals 1 to 3K motor units with slightly decreased recruitment. No fibrillations or positive waves were seen. The vastus lateralis muscle reveals 2 to 4K motor units with full recruitment. No fibrillations or positive waves were seen. The iliopsoas muscle reveals 2 to 4K motor units with full recruitment. No fibrillations or positive waves were seen. The biceps femoris muscle (long head) reveals 2 to 4K motor units with full recruitment. No fibrillations or positive waves were seen. The lumbosacral paraspinal muscles were tested at 3 levels, and revealed no abnormalities of insertional activity at all 3 levels tested. There was good  relaxation.   IMPRESSION:  Nerve conduction studies done on both lower extremities were unremarkable, no evidence of a peripheral neuropathy is seen. There is no evidence of tarsal tunnel syndrome. EMG evaluation of the right lower extremity was unremarkable, no evidence of an overlying lumbosacral radiculopathy was seen.  Marlan Palau. Keith Willis MD 11/05/2015 11:24 AM  [facility] [address] [place], [place] [id number]  Phone [phone] Fax [phone]

## 2015-11-14 DIAGNOSIS — N3000 Acute cystitis without hematuria: Secondary | ICD-10-CM | POA: Diagnosis not present

## 2015-11-14 DIAGNOSIS — Z23 Encounter for immunization: Secondary | ICD-10-CM | POA: Diagnosis not present

## 2015-11-19 ENCOUNTER — Ambulatory Visit (INDEPENDENT_AMBULATORY_CARE_PROVIDER_SITE_OTHER): Payer: 59 | Admitting: Family Medicine

## 2015-11-19 ENCOUNTER — Encounter: Payer: Self-pay | Admitting: Family Medicine

## 2015-11-19 VITALS — BP 112/72 | HR 64 | Temp 97.7°F | Ht 68.5 in | Wt 183.2 lb

## 2015-11-19 DIAGNOSIS — Z111 Encounter for screening for respiratory tuberculosis: Secondary | ICD-10-CM | POA: Diagnosis not present

## 2015-11-19 DIAGNOSIS — Z0000 Encounter for general adult medical examination without abnormal findings: Secondary | ICD-10-CM | POA: Diagnosis not present

## 2015-11-19 MED ORDER — TUBERCULIN PPD 5 UNIT/0.1ML ID SOLN: 5.0000 [IU] | Freq: Once | INTRADERMAL | Status: DC

## 2015-11-19 MED ORDER — AMPHETAMINE-DEXTROAMPHETAMINE 10 MG PO TABS: 10.0000 mg | ORAL_TABLET | Freq: Two times a day (BID) | ORAL | 0 refills | Status: AC

## 2015-11-19 NOTE — Patient Instructions (Signed)
Follow up on Friday for you TB test read.  I am happy to refill your adderall.  Take care  Dr. Adriana Simas  Health Maintenance, Female Adopting a healthy lifestyle and getting preventive care can go a long way to promote health and wellness. Talk with your health care provider about what schedule of regular examinations is right for you. This is a good chance for you to check in with your provider about disease prevention and staying healthy. In between checkups, there are plenty of things you can do on your own. Experts have done a lot of research about which lifestyle changes and preventive measures are most likely to keep you healthy. Ask your health care provider for more information. WEIGHT AND DIET  Eat a healthy diet  Be sure to include plenty of vegetables, fruits, low-fat dairy products, and lean protein.  Do not eat a lot of foods high in solid fats, added sugars, or salt.  Get regular exercise. This is one of the most important things you can do for your health.  Most adults should exercise for at least 150 minutes each week. The exercise should increase your heart rate and make you sweat (moderate-intensity exercise).  Most adults should also do strengthening exercises at least twice a week. This is in addition to the moderate-intensity exercise.  Maintain a healthy weight  Body mass index (BMI) is a measurement that can be used to identify possible weight problems. It estimates body fat based on height and weight. Your health care provider can help determine your BMI and help you achieve or maintain a healthy weight.  For females 56 years of age and older:   A BMI below 18.5 is considered underweight.  A BMI of 18.5 to 24.9 is normal.  A BMI of 25 to 29.9 is considered overweight.  A BMI of 30 and above is considered obese.  Watch levels of cholesterol and blood lipids  You should start having your blood tested for lipids and cholesterol at 25 years of age, then have  this test every 5 years.  You may need to have your cholesterol levels checked more often if:  Your lipid or cholesterol levels are high.  You are older than 25 years of age.  You are at high risk for heart disease.  CANCER SCREENING   Lung Cancer  Lung cancer screening is recommended for adults 39-20 years old who are at high risk for lung cancer because of a history of smoking.  A yearly low-dose CT scan of the lungs is recommended for people who:  Currently smoke.  Have quit within the past 15 years.  Have at least a 30-pack-year history of smoking. A pack year is smoking an average of one pack of cigarettes a day for 1 year.  Yearly screening should continue until it has been 15 years since you quit.  Yearly screening should stop if you develop a health problem that would prevent you from having lung cancer treatment.  Breast Cancer  Practice breast self-awareness. This means understanding how your breasts normally appear and feel.  It also means doing regular breast self-exams. Let your health care provider know about any changes, no matter how small.  If you are in your 20s or 30s, you should have a clinical breast exam (CBE) by a health care provider every 1-3 years as part of a regular health exam.  If you are 61 or older, have a CBE every year. Also consider having a breast X-ray (mammogram)  every year.  If you have a family history of breast cancer, talk to your health care provider about genetic screening.  If you are at high risk for breast cancer, talk to your health care provider about having an MRI and a mammogram every year.  Breast cancer gene (BRCA) assessment is recommended for women who have family members with BRCA-related cancers. BRCA-related cancers include:  Breast.  Ovarian.  Tubal.  Peritoneal cancers.  Results of the assessment will determine the need for genetic counseling and BRCA1 and BRCA2 testing. Cervical Cancer Your health care  provider may recommend that you be screened regularly for cancer of the pelvic organs (ovaries, uterus, and vagina). This screening involves a pelvic examination, including checking for microscopic changes to the surface of your cervix (Pap test). You may be encouraged to have this screening done every 3 years, beginning at age 54.  For women ages 63-65, health care providers may recommend pelvic exams and Pap testing every 3 years, or they may recommend the Pap and pelvic exam, combined with testing for human papilloma virus (HPV), every 5 years. Some types of HPV increase your risk of cervical cancer. Testing for HPV may also be done on women of any age with unclear Pap test results.  Other health care providers may not recommend any screening for nonpregnant women who are considered low risk for pelvic cancer and who do not have symptoms. Ask your health care provider if a screening pelvic exam is right for you.  If you have had past treatment for cervical cancer or a condition that could lead to cancer, you need Pap tests and screening for cancer for at least 20 years after your treatment. If Pap tests have been discontinued, your risk factors (such as having a new sexual partner) need to be reassessed to determine if screening should resume. Some women have medical problems that increase the chance of getting cervical cancer. In these cases, your health care provider may recommend more frequent screening and Pap tests. Colorectal Cancer  This type of cancer can be detected and often prevented.  Routine colorectal cancer screening usually begins at 25 years of age and continues through 25 years of age.  Your health care provider may recommend screening at an earlier age if you have risk factors for colon cancer.  Your health care provider may also recommend using home test kits to check for hidden blood in the stool.  A small camera at the end of a tube can be used to examine your colon directly  (sigmoidoscopy or colonoscopy). This is done to check for the earliest forms of colorectal cancer.  Routine screening usually begins at age 43.  Direct examination of the colon should be repeated every 5-10 years through 25 years of age. However, you may need to be screened more often if early forms of precancerous polyps or small growths are found. Skin Cancer  Check your skin from head to toe regularly.  Tell your health care provider about any new moles or changes in moles, especially if there is a change in a mole's shape or color.  Also tell your health care provider if you have a mole that is larger than the size of a pencil eraser.  Always use sunscreen. Apply sunscreen liberally and repeatedly throughout the day.  Protect yourself by wearing long sleeves, pants, a wide-brimmed hat, and sunglasses whenever you are outside. HEART DISEASE, DIABETES, AND HIGH BLOOD PRESSURE   High blood pressure causes heart disease and  increases the risk of stroke. High blood pressure is more likely to develop in:  People who have blood pressure in the high end of the normal range (130-139/85-89 mm Hg).  People who are overweight or obese.  People who are African American.  If you are 42-42 years of age, have your blood pressure checked every 3-5 years. If you are 1 years of age or older, have your blood pressure checked every year. You should have your blood pressure measured twice--once when you are at a hospital or clinic, and once when you are not at a hospital or clinic. Record the average of the two measurements. To check your blood pressure when you are not at a hospital or clinic, you can use:  An automated blood pressure machine at a pharmacy.  A home blood pressure monitor.  If you are between 59 years and 47 years old, ask your health care provider if you should take aspirin to prevent strokes.  Have regular diabetes screenings. This involves taking a blood sample to check your  fasting blood sugar level.  If you are at a normal weight and have a low risk for diabetes, have this test once every three years after 24 years of age.  If you are overweight and have a high risk for diabetes, consider being tested at a younger age or more often. PREVENTING INFECTION  Hepatitis B  If you have a higher risk for hepatitis B, you should be screened for this virus. You are considered at high risk for hepatitis B if:  You were born in a country where hepatitis B is common. Ask your health care provider which countries are considered high risk.  Your parents were born in a high-risk country, and you have not been immunized against hepatitis B (hepatitis B vaccine).  You have HIV or AIDS.  You use needles to inject street drugs.  You live with someone who has hepatitis B.  You have had sex with someone who has hepatitis B.  You get hemodialysis treatment.  You take certain medicines for conditions, including cancer, organ transplantation, and autoimmune conditions. Hepatitis C  Blood testing is recommended for:  Everyone born from [birth year] through 1965.  Anyone with known risk factors for hepatitis C. Sexually transmitted infections (STIs)  You should be screened for sexually transmitted infections (STIs) including gonorrhea and chlamydia if:  You are sexually active and are younger than 25 years of age.  You are older than 25 years of age and your health care provider tells you that you are at risk for this type of infection.  Your sexual activity has changed since you were last screened and you are at an increased risk for chlamydia or gonorrhea. Ask your health care provider if you are at risk.  If you do not have HIV, but are at risk, it may be recommended that you take a prescription medicine daily to prevent HIV infection. This is called pre-exposure prophylaxis (PrEP). You are considered at risk if:  You are sexually active and do not regularly use condoms or  know the HIV status of your partner(s).  You take drugs by injection.  You are sexually active with a partner who has HIV. Talk with your health care provider about whether you are at high risk of being infected with HIV. If you choose to begin PrEP, you should first be tested for HIV. You should then be tested every 3 months for as long as you are taking PrEP.  PREGNANCY   If you are premenopausal and you may become pregnant, ask your health care provider about preconception counseling.  If you may become pregnant, take 400 to 800 micrograms (mcg) of folic acid every day.  If you want to prevent pregnancy, talk to your health care provider about birth control (contraception). OSTEOPOROSIS AND MENOPAUSE   Osteoporosis is a disease in which the bones lose minerals and strength with aging. This can result in serious bone fractures. Your risk for osteoporosis can be identified using a bone density scan.  If you are 31 years of age or older, or if you are at risk for osteoporosis and fractures, ask your health care provider if you should be screened.  Ask your health care provider whether you should take a calcium or vitamin D supplement to lower your risk for osteoporosis.  Menopause may have certain physical symptoms and risks.  Hormone replacement therapy may reduce some of these symptoms and risks. Talk to your health care provider about whether hormone replacement therapy is right for you.  HOME CARE INSTRUCTIONS   Schedule regular health, dental, and eye exams.  Stay current with your immunizations.   Do not use any tobacco products including cigarettes, chewing tobacco, or electronic cigarettes.  If you are pregnant, do not drink alcohol.  If you are breastfeeding, limit how much and how often you drink alcohol.  Limit alcohol intake to no more than 1 drink per day for nonpregnant women. One drink equals 12 ounces of beer, 5 ounces of wine, or 1 ounces of hard liquor.  Do  not use street drugs.  Do not share needles.  Ask your health care provider for help if you need support or information about quitting drugs.  Tell your health care provider if you often feel depressed.  Tell your health care provider if you have ever been abused or do not feel safe at home.   This information is not intended to replace advice given to you by your health care provider. Make sure you discuss any questions you have with your health care provider.   Document Released: 07/27/2010 Document Revised: 02/01/2014 Document Reviewed: 12/13/2012 Elsevier Interactive Patient Education [employer].

## 2015-11-19 NOTE — Progress Notes (Signed)
Subjective:  Patient ID: Tammie Boone, female    DOB: [birth year]  Age: 25 y.o. MRN: [id number]  CC: Annual physical   HPI:  25 year old female with ADD presents for an annual physical exam. 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   Preventative Healthcare  Pap smear: Up to date.   Immunizations  Tetanus - Up to date.   Flu - Up to date.   Alcohol use: Occasional alcohol.  Smoking/tobacco use: Nonsmoker.  STD/HIV testing: Declines.  In need of TB testing for school.   PMH, Surgical Hx, Family Hx, Social History reviewed and updated as below.  Past Medical History:  Diagnosis Date  . Constipation   . 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 TONSILLECTOMY AND ADENOIDECTOMY     Family History  Problem Relation Age of Onset  . Colon cancer Father 39  . Kidney disease Maternal Grandfather    Social History  Substance Use Topics  . Smoking status: Never Smoker  . Smokeless tobacco: Never Used  . Alcohol use Yes     Comment: occ.   Review of Systems  Genitourinary:       Lump in breast (previously; had [place]).  Musculoskeletal:       Right foot pain.  All other systems reviewed and are negative.  Objective:  BP 112/72 (BP Location: Right Arm, Patient Position: Sitting, Cuff Size: Normal)   Pulse 64   Temp 97.7 F (36.5 C) (Oral)   Ht 5' 8.5" (1.74 m)   Wt 183 lb 4 oz (83.1 kg)   SpO2 96%   BMI 27.46 kg/m   BP/Weight 11/19/2015 04/02/2014 10/29/2013  Systolic BP 112 90 96  Diastolic BP 72 70 56  Wt. (Lbs) 183.25 174.2 173.4  BMI 27.46 27.28 27.15   Physical Exam  Constitutional: She is oriented to person, place, and time. She appears well-developed and well-nourished. No distress.  HENT:  Head: Normocephalic and atraumatic.  Nose: Nose normal.  Mouth/Throat: Oropharynx is clear and moist. No oropharyngeal exudate.    Normal TM's bilaterally.   Eyes: Conjunctivae are normal. No scleral icterus.  Neck: Neck supple. No thyromegaly present.  Cardiovascular: Normal rate and regular rhythm.   No murmur heard. Pulmonary/Chest: Effort normal and breath sounds normal. She has no wheezes. She has no rales.  Abdominal: Soft. She exhibits no distension. There is no tenderness. There is no rebound and no guarding.  Musculoskeletal: Normal range of motion. She exhibits no edema.  Lymphadenopathy:    She has no cervical adenopathy.  Neurological: She is alert and oriented to person, place, and time.  Skin: Skin is warm and dry. No rash noted.  Psychiatric: She has a normal mood and affect.  Vitals reviewed.  Lab Results  Component Value Date   WBC 7.6 09/30/2010   HGB 13.4 09/30/2010   HCT 39.1 09/30/2010   PLT 224 09/30/2010   TSH 1.551 09/30/2010    Assessment & Plan:   Problem List Items Addressed This Visit    Annual physical exam - Primary    Pap smear up-to-date. Vaccinations up-to-date. In need of TB testing today.       Other Visit Diagnoses    Encounter for PPD test       Relevant Orders   TB Skin Test (Completed)      Meds ordered  this encounter  Medications  . DISCONTD: tuberculin injection 5 Units  . amphetamine-dextroamphetamine (ADDERALL) 10 MG tablet    Sig: Take 1 tablet (10 mg total) by mouth 2 (two) times daily.    Dispense:  60 tablet    Refill:  0   Follow-up: Later this week for TB skin test reading  Everlene OtherJayce Roshana Shuffield DO [facility] [place]

## 2015-11-19 NOTE — Assessment & Plan Note (Signed)
Pap smear up-to-date. Vaccinations up-to-date. In need of TB testing today.

## 2015-11-19 NOTE — Progress Notes (Signed)
Pre visit review using our clinic review tool, if applicable. No additional management support is needed unless otherwise documented below in the visit note. 

## 2015-11-21 ENCOUNTER — Other Ambulatory Visit (INDEPENDENT_AMBULATORY_CARE_PROVIDER_SITE_OTHER): Payer: 59

## 2015-11-21 ENCOUNTER — Ambulatory Visit: Payer: 59

## 2015-11-21 ENCOUNTER — Encounter: Payer: 59 | Admitting: Family Medicine

## 2015-11-21 DIAGNOSIS — Z0184 Encounter for antibody response examination: Secondary | ICD-10-CM | POA: Diagnosis not present

## 2015-11-21 DIAGNOSIS — Z111 Encounter for screening for respiratory tuberculosis: Secondary | ICD-10-CM

## 2015-11-21 LAB — TB SKIN TEST
Induration: 0 mm
TB Skin Test: NEGATIVE

## 2015-11-24 ENCOUNTER — Encounter: Payer: Self-pay | Admitting: Family Medicine

## 2015-11-24 LAB — VARICELLA ZOSTER ANTIBODY, IGG: VARICELLA IGG: 1767 {index} — AB (ref <135.00)

## 2015-12-03 DIAGNOSIS — H5213 Myopia, bilateral: Secondary | ICD-10-CM | POA: Diagnosis not present

## 2015-12-10 DIAGNOSIS — J301 Allergic rhinitis due to pollen: Secondary | ICD-10-CM | POA: Diagnosis not present

## 2015-12-10 DIAGNOSIS — Z6827 Body mass index (BMI) 27.0-27.9, adult: Secondary | ICD-10-CM | POA: Diagnosis not present

## 2015-12-10 DIAGNOSIS — R51 Headache: Secondary | ICD-10-CM | POA: Diagnosis not present

## 2015-12-10 DIAGNOSIS — E669 Obesity, unspecified: Secondary | ICD-10-CM | POA: Diagnosis not present

## 2015-12-10 DIAGNOSIS — M542 Cervicalgia: Secondary | ICD-10-CM | POA: Diagnosis not present

## 2015-12-11 ENCOUNTER — Telehealth: Payer: 59 | Admitting: Nurse Practitioner

## 2015-12-11 DIAGNOSIS — J069 Acute upper respiratory infection, unspecified: Secondary | ICD-10-CM | POA: Diagnosis not present

## 2015-12-11 MED ORDER — AZITHROMYCIN 250 MG PO TABS: ORAL_TABLET | ORAL | 0 refills | Status: DC

## 2015-12-11 MED ORDER — BENZONATATE 100 MG PO CAPS: 100.0000 mg | ORAL_CAPSULE | Freq: Two times a day (BID) | ORAL | 0 refills | Status: DC | PRN

## 2015-12-11 NOTE — Progress Notes (Signed)

## 2015-12-17 DIAGNOSIS — Z713 Dietary counseling and surveillance: Secondary | ICD-10-CM | POA: Diagnosis not present

## 2015-12-17 DIAGNOSIS — R5383 Other fatigue: Secondary | ICD-10-CM | POA: Diagnosis not present

## 2015-12-17 DIAGNOSIS — E669 Obesity, unspecified: Secondary | ICD-10-CM | POA: Diagnosis not present

## 2015-12-26 DIAGNOSIS — R5383 Other fatigue: Secondary | ICD-10-CM | POA: Diagnosis not present

## 2015-12-26 DIAGNOSIS — E669 Obesity, unspecified: Secondary | ICD-10-CM | POA: Diagnosis not present

## 2015-12-26 DIAGNOSIS — Z713 Dietary counseling and surveillance: Secondary | ICD-10-CM | POA: Diagnosis not present

## 2016-01-01 DIAGNOSIS — N3001 Acute cystitis with hematuria: Secondary | ICD-10-CM | POA: Diagnosis not present

## 2016-01-01 DIAGNOSIS — R300 Dysuria: Secondary | ICD-10-CM | POA: Diagnosis not present

## 2016-01-02 DIAGNOSIS — E669 Obesity, unspecified: Secondary | ICD-10-CM | POA: Diagnosis not present

## 2016-01-02 DIAGNOSIS — R5383 Other fatigue: Secondary | ICD-10-CM | POA: Diagnosis not present

## 2016-01-02 DIAGNOSIS — Z713 Dietary counseling and surveillance: Secondary | ICD-10-CM | POA: Diagnosis not present

## 2016-01-09 DIAGNOSIS — E669 Obesity, unspecified: Secondary | ICD-10-CM | POA: Diagnosis not present

## 2016-01-09 DIAGNOSIS — Z713 Dietary counseling and surveillance: Secondary | ICD-10-CM | POA: Diagnosis not present

## 2016-01-09 DIAGNOSIS — R5383 Other fatigue: Secondary | ICD-10-CM | POA: Diagnosis not present

## 2016-01-15 DIAGNOSIS — N3020 Other chronic cystitis without hematuria: Secondary | ICD-10-CM | POA: Diagnosis not present

## 2016-01-15 DIAGNOSIS — R350 Frequency of micturition: Secondary | ICD-10-CM | POA: Diagnosis not present

## 2016-01-28 ENCOUNTER — Other Ambulatory Visit: Payer: Self-pay | Admitting: Urology

## 2016-01-29 ENCOUNTER — Encounter (HOSPITAL_BASED_OUTPATIENT_CLINIC_OR_DEPARTMENT_OTHER): Payer: Self-pay | Admitting: *Deleted

## 2016-01-29 NOTE — Progress Notes (Signed)
[facility]Hg urine pregnancy on arrival-instructed Npo  after Mn

## 2016-01-30 ENCOUNTER — Ambulatory Visit (HOSPITAL_BASED_OUTPATIENT_CLINIC_OR_DEPARTMENT_OTHER)
Admission: RE | Admit: 2016-01-30 | Discharge: 2016-01-30 | Disposition: A | Discharge status: Home / Self Care | Payer: 59 | Source: Ambulatory Visit | Attending: Urology | Admitting: Urology

## 2016-01-30 ENCOUNTER — Encounter (HOSPITAL_BASED_OUTPATIENT_CLINIC_OR_DEPARTMENT_OTHER)
Admission: RE | Disposition: A | Discharge status: Home / Self Care | Payer: Self-pay | Source: Ambulatory Visit | Attending: Urology

## 2016-01-30 ENCOUNTER — Ambulatory Visit (HOSPITAL_BASED_OUTPATIENT_CLINIC_OR_DEPARTMENT_OTHER): Payer: 59 | Admitting: Anesthesiology

## 2016-01-30 ENCOUNTER — Encounter (HOSPITAL_BASED_OUTPATIENT_CLINIC_OR_DEPARTMENT_OTHER): Payer: Self-pay | Admitting: Anesthesiology

## 2016-01-30 DIAGNOSIS — F900 Attention-deficit hyperactivity disorder, predominantly inattentive type: Secondary | ICD-10-CM | POA: Diagnosis not present

## 2016-01-30 DIAGNOSIS — Z87440 Personal history of urinary (tract) infections: Secondary | ICD-10-CM | POA: Insufficient documentation

## 2016-01-30 DIAGNOSIS — N390 Urinary tract infection, site not specified: Secondary | ICD-10-CM | POA: Diagnosis not present

## 2016-01-30 DIAGNOSIS — N3020 Other chronic cystitis without hematuria: Secondary | ICD-10-CM | POA: Diagnosis not present

## 2016-01-30 DIAGNOSIS — R102 Pelvic and perineal pain: Secondary | ICD-10-CM | POA: Diagnosis not present

## 2016-01-30 HISTORY — DX: Dysuria: R30.0

## 2016-01-30 HISTORY — PX: CYSTO WITH HYDRODISTENSION: SHX5453

## 2016-01-30 HISTORY — DX: Pelvic and perineal pain: R10.2

## 2016-01-30 HISTORY — DX: Personal history of urinary (tract) infections: Z87.440

## 2016-01-30 LAB — POCT PREGNANCY, URINE: PREG TEST UR: NEGATIVE

## 2016-01-30 SURGERY — CYSTOSCOPY, WITH BLADDER HYDRODISTENSION
Anesthesia: General | Site: Bladder

## 2016-01-30 MED ORDER — ONDANSETRON HCL 4 MG/2ML IJ SOLN
INTRAMUSCULAR | Status: DC | PRN
  Administered 2016-01-30: 4 mg via INTRAVENOUS

## 2016-01-30 MED ORDER — LIDOCAINE 2% (20 MG/ML) 5 ML SYRINGE
INTRAMUSCULAR | Status: DC | PRN
  Administered 2016-01-30: 50 mg via INTRAVENOUS

## 2016-01-30 MED ORDER — ONDANSETRON HCL 4 MG/2ML IJ SOLN
INTRAMUSCULAR | Status: AC
  Filled 2016-01-30: qty 2

## 2016-01-30 MED ORDER — DEXAMETHASONE SODIUM PHOSPHATE 4 MG/ML IJ SOLN
INTRAMUSCULAR | Status: DC | PRN
  Administered 2016-01-30: 10 mg via INTRAVENOUS

## 2016-01-30 MED ORDER — PHENAZOPYRIDINE HCL 200 MG PO TABS
ORAL | Status: DC | PRN
  Administered 2016-01-30: 15 mL via INTRAVESICAL

## 2016-01-30 MED ORDER — CIPROFLOXACIN IN D5W 400 MG/200ML IV SOLN
INTRAVENOUS | Status: AC
  Filled 2016-01-30: qty 200

## 2016-01-30 MED ORDER — LACTATED RINGERS IV SOLN
INTRAVENOUS | Status: DC
  Administered 2016-01-30 (×2): via INTRAVENOUS
  Filled 2016-01-30: qty 1000

## 2016-01-30 MED ORDER — KETOROLAC TROMETHAMINE 30 MG/ML IJ SOLN
INTRAMUSCULAR | Status: AC
  Filled 2016-01-30: qty 1

## 2016-01-30 MED ORDER — DEXAMETHASONE SODIUM PHOSPHATE 10 MG/ML IJ SOLN
INTRAMUSCULAR | Status: AC
  Filled 2016-01-30: qty 1

## 2016-01-30 MED ORDER — HYDROCODONE-ACETAMINOPHEN 5-325 MG PO TABS
1.0000 | ORAL_TABLET | Freq: Once | ORAL | Status: AC
  Administered 2016-01-30: 1 via ORAL
  Filled 2016-01-30: qty 1

## 2016-01-30 MED ORDER — CIPROFLOXACIN IN D5W 400 MG/200ML IV SOLN
400.0000 mg | INTRAVENOUS | Status: AC
  Administered 2016-01-30: 400 mg via INTRAVENOUS
  Filled 2016-01-30: qty 200

## 2016-01-30 MED ORDER — METOCLOPRAMIDE HCL 5 MG/ML IJ SOLN
10.0000 mg | Freq: Once | INTRAMUSCULAR | Status: DC | PRN
  Filled 2016-01-30: qty 2

## 2016-01-30 MED ORDER — HYDROCODONE-ACETAMINOPHEN 7.5-325 MG PO TABS
1.0000 | ORAL_TABLET | Freq: Once | ORAL | Status: DC | PRN
  Filled 2016-01-30: qty 1

## 2016-01-30 MED ORDER — FENTANYL CITRATE (PF) 100 MCG/2ML IJ SOLN
25.0000 ug | INTRAMUSCULAR | Status: DC | PRN
  Filled 2016-01-30: qty 1

## 2016-01-30 MED ORDER — FENTANYL CITRATE (PF) 100 MCG/2ML IJ SOLN
INTRAMUSCULAR | Status: DC | PRN
  Administered 2016-01-30: 50 ug via INTRAVENOUS
  Administered 2016-01-30 (×2): 25 ug via INTRAVENOUS

## 2016-01-30 MED ORDER — STERILE WATER FOR IRRIGATION IR SOLN
Status: DC | PRN
  Administered 2016-01-30: 1000 mL

## 2016-01-30 MED ORDER — HYDROCODONE-ACETAMINOPHEN 5-325 MG PO TABS: 1.0000 | ORAL_TABLET | Freq: Four times a day (QID) | ORAL | 0 refills | Status: DC | PRN

## 2016-01-30 MED ORDER — PROPOFOL 10 MG/ML IV BOLUS
INTRAVENOUS | Status: AC
  Filled 2016-01-30: qty 20

## 2016-01-30 MED ORDER — HYDROCODONE-ACETAMINOPHEN 5-325 MG PO TABS
ORAL_TABLET | ORAL | Status: AC
  Filled 2016-01-30: qty 1

## 2016-01-30 MED ORDER — PROPOFOL 10 MG/ML IV BOLUS
INTRAVENOUS | Status: DC | PRN
  Administered 2016-01-30: 50 mg via INTRAVENOUS
  Administered 2016-01-30: 150 mg via INTRAVENOUS

## 2016-01-30 MED ORDER — FENTANYL CITRATE (PF) 100 MCG/2ML IJ SOLN
INTRAMUSCULAR | Status: AC
  Filled 2016-01-30: qty 2

## 2016-01-30 MED ORDER — MEPERIDINE HCL 25 MG/ML IJ SOLN
6.2500 mg | INTRAMUSCULAR | Status: DC | PRN
  Filled 2016-01-30: qty 1

## 2016-01-30 MED FILL — HYDROCODON-APAP 5-325: 5-325 | 7 days supply | Qty: 30 | Fill #0

## 2016-01-30 SURGICAL SUPPLY — 21 items
BAG DRAIN URO-CYSTO SKYTR STRL (DRAIN) ×2 IMPLANT
BAG DRN UROCATH (DRAIN) ×1
CATH FOLEY 2WAY SLVR  5CC 18FR (CATHETERS)
CATH FOLEY 2WAY SLVR 5CC 18FR (CATHETERS) IMPLANT
CATH ROBINSON RED A/P 12FR (CATHETERS) IMPLANT
CATH ROBINSON RED A/P 14FR (CATHETERS) ×2 IMPLANT
CLOTH BEACON ORANGE TIMEOUT ST (SAFETY) ×2 IMPLANT
ELECT REM PT RETURN 9FT ADLT (ELECTROSURGICAL)
ELECTRODE REM PT RTRN 9FT ADLT (ELECTROSURGICAL) IMPLANT
GLOVE BIO SURGEON STRL SZ7.5 (GLOVE) ×2 IMPLANT
GOWN STRL REUS W/ TWL XL LVL3 (GOWN DISPOSABLE) ×1 IMPLANT
GOWN STRL REUS W/TWL XL LVL3 (GOWN DISPOSABLE) ×2
KIT ROOM TURNOVER WOR (KITS) ×2 IMPLANT
MANIFOLD NEPTUNE II (INSTRUMENTS) IMPLANT
NDL SAFETY ECLIPSE 18X1.5 (NEEDLE) ×1 IMPLANT
NEEDLE HYPO 18GX1.5 SHARP (NEEDLE) ×2
PACK CYSTO (CUSTOM PROCEDURE TRAY) ×2 IMPLANT
SUT SILK 0 TIES 10X30 (SUTURE) IMPLANT
SYR 20CC LL (SYRINGE) ×2 IMPLANT
TUBE CONNECTING 12X1/4 (SUCTIONS) IMPLANT
WATER STERILE IRR 3000ML UROMA (IV SOLUTION) ×2 IMPLANT

## 2016-01-30 NOTE — Anesthesia Postprocedure Evaluation (Signed)
Anesthesia Post Note  Patient: Tammie Boone  Procedure(s) Performed: Procedure(s) (LRB): CYSTOSCOPY/HYDRODISTENSION AND INSTILLATION (N/A)  Patient location during evaluation: PACU Anesthesia Type: General Level of consciousness: awake and alert and oriented Pain management: pain level controlled Vital Signs Assessment: post-procedure vital signs reviewed and stable Respiratory status: spontaneous breathing, nonlabored ventilation and respiratory function stable Cardiovascular status: blood pressure returned to baseline and stable Postop Assessment: no signs of nausea or vomiting Anesthetic complications: no       Last Vitals:  Vitals:   01/30/16 1315 01/30/16 1330  BP: 91/64 95/71  Pulse: 65 60  Resp: 16 19  Temp:      Last Pain:  Vitals:   01/30/16 1100  TempSrc:   PainSc: 3                  Donnarae Rae A.

## 2016-01-30 NOTE — Discharge Instructions (Signed)
CYSTOSCOPY HOME CARE INSTRUCTIONS  Activity: Rest for the remainder of the day.  Do not drive or operate equipment today.  You may resume normal activities in one to two days as instructed by your physician.   Meals: Drink plenty of liquids and eat light foods such as gelatin or soup this evening.  You may return to a normal meal plan tomorrow.  Return to Work: You may return to work in one to two days or as instructed by your physician.  Special Instructions / Symptoms: Call your physician if any of these symptoms occur:   -persistent or heavy bleeding  -bleeding which continues after first few urination  -large blood clots that are difficult to pass  -urine stream diminishes or stops completely  -fever equal to or higher than 101 degrees Farenheit.  -cloudy urine with a strong, foul odor  -severe pain  Females should always wipe from front to back after elimination.  You may feel some burning pain when you urinate.  This should disappear with time.  Applying moist heat to the lower abdomen or a hot tub bath may help relieve the pain. \  Follow-Up / Date of Return Visit to Your Physician: Call for an appointment to arrange follow-up.  Patient Signature:  ________________________________________________________  Nurse's Signature:  ________________________________________________________  Post Anesthesia Home Care Instructions  Activity: Get plenty of rest for the remainder of the day. A responsible adult should stay with you for 24 hours following the procedure.  For the next 24 hours, DO NOT: -Drive a car -Advertising copywriterperate machinery -Drink alcoholic beverages -Take any medication unless instructed by your physician -Make any legal decisions or sign important papers.  Meals: Start with liquid foods such as gelatin or soup. Progress to regular foods as tolerated. Avoid greasy, spicy, heavy foods. If nausea and/or vomiting occur, drink only clear liquids until the nausea and/or  vomiting subsides. Call your physician if vomiting continues.  Special Instructions/Symptoms: Your throat may feel dry or sore from the anesthesia or the breathing tube placed in your throat during surgery. If this causes discomfort, gargle with warm salt water. The discomfort should disappear within 24 hours.  If you had a scopolamine patch placed behind your ear for the management of post- operative nausea and/or vomiting:  1. The medication in the patch is effective for 72 hours, after which it should be removed.  Wrap patch in a tissue and discard in the trash. Wash hands thoroughly with soap and water. 2. You may remove the patch earlier than 72 hours if you experience unpleasant side effects which may include dry mouth, dizziness or visual disturbances. 3. Avoid touching the patch. Wash your hands with soap and water after contact with the patch.   I have reviewed discharge instructions in detail with the patient. They will follow-up with me or their physician as scheduled. My nurse will also be calling the patients as per protocol.

## 2016-01-30 NOTE — Interval H&P Note (Signed)
History and Physical Interval Note:  01/30/2016 11:39 AM  Tammie Boone  has presented today for surgery, with the diagnosis of PELVIC PAIN  The various methods of treatment have been discussed with the patient and family. After consideration of risks, benefits and other options for treatment, the patient has consented to  Procedure(s): CYSTOSCOPY/HYDRODISTENSION AND INSTILLATION (N/A) as a surgical intervention .  The patient's history has been reviewed, patient examined, no change in status, stable for surgery.  I have reviewed the patient's chart and labs.  Questions were answered to the patient's satisfaction.     Sagrario Lineberry A

## 2016-01-30 NOTE — Anesthesia Procedure Notes (Signed)
Procedure Name: LMA Insertion Date/Time: 01/30/2016 12:29 PM Performed by: Tyrone NineSAUVE, Euva Rundell F Pre-anesthesia Checklist: Timeout performed, Patient identified, Emergency Drugs available, Suction available and Patient being monitored Patient Re-evaluated:Patient Re-evaluated prior to inductionOxygen Delivery Method: Circle system utilized Preoxygenation: Pre-oxygenation with 100% oxygen Intubation Type: IV induction Ventilation: Mask ventilation without difficulty LMA: LMA inserted LMA Size: 4.0 Number of attempts: 1 Placement Confirmation: positive ETCO2 and breath sounds checked- equal and bilateral Tube secured with: Tape Dental Injury: Teeth and Oropharynx as per pre-operative assessment

## 2016-01-30 NOTE — H&P (Signed)
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 She went on Macrodantin in April 2015. She was going to take it after intercourse initially   It did appear that [name] had a breakthrough infection treated with ciprofloxacin recently. From time to time she will get a little stinging on once-a-day Macrodantin, but with fluids and cranberry, etc, things settle down.  Ms Warmoth's prescription was renewed. See in a year. If she starts having any breakthroughs, I will switch her. She is going to ask her gynecologist if birth control can cause infections, but I do not think there is a relation here.   Today  The patient had stopped urinary prophylaxis and birth control pill and she truly believe the latter stopped her bladder infections. In the last 2 months she can get a lot of discomfort near the urethra and after she voids but then it does help the pressure in that area. She is not having suprapubic discomfort. Sometimes she has pain with intercourse and has avoided intercourse for this reason in the last 2 months. She describes having 2 negative urine cultures in the last 2 months. She will drink a lot of water and this helps and her transient increase in frequency is likely related to the water intake   There is no other aggravating or relieving factors  There is no other associated signs and symptoms  The severity of the symptoms is moderate  The symptoms are ongoing and bothersome   On pelvic examination she had no tenderness or prolapse or vaginitis   I discussed with her the possibility of having interstitial cystitis and the role of a hydrodistention.   The patient understands that they she/he could have interstitial cystitis. We talked about the role of cystoscopy/hydrodistension and instillation in detail. Pros, cons, general surgical and anesthetic risks, and other options including watchful waiting were discussed. Risks were described but not limited to pain, infection,  and bleeding. The risk of bladder perforation and management were discussed. The patient understands that it is primarily a diagnostic procedure   [place]   We will call her next week and schedule a hydrodistention.     ALLERGIES: Sulfa Drugs - Has tolerated recently    MEDICATIONS: Adderall 10 MG Oral Tablet Oral  Periogard     GU PSH: None     PSH Notes: Tonsillectomy, Complete Colonoscopy   NON-GU PSH: Diagnostic Colonoscopy - 2015 Remove Tonsils - 2015    GU PMH: Recurrent Cystitis w/o hematuria, Chronic cystitis - 2016 Urinary Frequency, Increased urinary frequency - 2016 Nocturia, Nocturia - 2015    NON-GU PMH: Encounter for general adult medical examination without abnormal findings, Encounter for preventive health examination - 2016    FAMILY HISTORY: Atrial Fibrillation - Runs In Family Colon Cancer - Runs In Family Diabetes - Runs In Family Hypertension - Runs In Family Kidney Stones - Runs In Family   SOCIAL HISTORY: None    Notes: Student, No caffeine use, Single, Alcohol use, Non-smoker, Death in the family, father   REVIEW OF SYSTEMS:    GU Review Female:   Patient reports burning /pain with urination. Patient denies frequent urination, hard to postpone urination, get up at night to urinate, leakage of urine, stream starts and stops, trouble starting your stream, have to strain to urinate, and currently pregnant.  Gastrointestinal (Upper):   Patient denies nausea, vomiting, and indigestion/ heartburn.  Gastrointestinal (Lower):   Patient denies diarrhea and constipation.  Constitutional:  Patient denies fever, weight loss, fatigue, and night sweats.  Skin:   Patient denies skin rash/ lesion and itching.  Eyes:   Patient denies blurred vision and double vision.  Ears/ Nose/ Throat:   Patient denies sore throat and sinus problems.  Hematologic/Lymphatic:   Patient denies swollen glands and easy bruising.  Cardiovascular:    Patient denies leg swelling and chest pains.  Respiratory:   Patient denies cough and shortness of breath.  Endocrine:   Patient denies excessive thirst.  Musculoskeletal:   Patient denies back pain and joint pain.  Neurological:   Patient denies headaches and dizziness.  Psychologic:   Patient denies depression and anxiety.   VITAL SIGNS:      01/15/2016 08:46 AM  Weight 168 lb / 76.2 kg  Height 68 in / 172.72 cm  BP 94/65 mmHg  Pulse 75 /min  Temperature 97.8 F / 37 C  BMI 25.5 kg/m   PAST DATA REVIEWED:  Source Of History:  Patient   PROCEDURES:          Urinalysis Dipstick Dipstick Cont'd  Color: Yellow Bilirubin: Neg  Appearance: Clear Ketones: Neg  Specific Gravity: <=1.005 Blood: Neg  pH: 6.0 Protein: Neg  Glucose: Neg Urobilinogen: 0.2    Nitrites: Neg    Leukocyte Esterase: Neg    ASSESSMENT:      ICD-10 Details  1 GU:   Recurrent Cystitis w/o hematuria - N30.20   2   Urinary Frequency - R35.0      PLAN:            Medications Stop Meds: [place] TABS Oral  Start: 04/02/2013  Discontinue: 01/15/2016  - Reason: The medication cycle was completed.  Nitrofurantoin Macrocrystal 100 MG Oral Capsule 0 Oral Daily  Start: 05/18/2013  Discontinue: 01/15/2016  - Reason: pt chose to d/c           Orders Labs Urine Culture and Sensitivity          Schedule Return Visit/Planned Activity: Return PRN - Office Visit             Note: we will call the pt   After a thorough review of the management options for the patient's condition the patient  elected to proceed with surgical therapy as noted above. We have discussed the potential benefits and risks of the procedure, side effects of the proposed treatment, the likelihood of the patient achieving the goals of the procedure, and any potential problems that might occur during the procedure or recuperation. Informed consent has been obtained.

## 2016-01-30 NOTE — Transfer of Care (Signed)
Immediate Anesthesia Transfer of Care Note  Patient: Tammie Boone  Procedure(s) Performed: Procedure(s): CYSTOSCOPY/HYDRODISTENSION AND INSTILLATION (N/A)  Patient Location: PACU  Anesthesia Type:General  Level of Consciousness: awake, alert , oriented and patient cooperative  Airway & Oxygen Therapy: Patient Spontanous Breathing and Patient connected to nasal cannula oxygen  Post-op Assessment: Report given to RN and Post -op Vital signs reviewed and stable  Post vital signs: Reviewed and stable  Last Vitals:  Vitals:   01/30/16 1037 01/30/16 1245  BP: 99/60 100/72  Pulse: 77 91  Resp: 14   Temp: 36.3 C 36.5 C    Last Pain:  Vitals:   01/30/16 1100  TempSrc:   PainSc: 3       Patients Stated Pain Goal: 8 (01/30/16 1100)  Complications: No apparent anesthesia complications

## 2016-01-30 NOTE — Anesthesia Preprocedure Evaluation (Addendum)
Anesthesia Evaluation  Patient identified by MRN, date of birth, ID band Patient awake    Reviewed: Allergy & Precautions, NPO status , Patient's Chart, lab work & pertinent test results  Airway Mallampati: II  TM Distance: >3 FB Neck ROM: Full    Dental no notable dental hx. (+) Teeth Intact, Dental Advisory Given   Pulmonary neg pulmonary ROS,    Pulmonary exam normal breath sounds clear to auscultation       Cardiovascular negative cardio ROS Normal cardiovascular exam Rhythm:Regular Rate:Normal     Neuro/Psych ADHDnegative neurological ROS     GI/Hepatic negative GI ROS, Neg liver ROS,   Endo/Other  negative endocrine ROS  Renal/GU Recurrent UTI  negative genitourinary   Musculoskeletal negative musculoskeletal ROS (+)   Abdominal   Peds  Hematology negative hematology ROS (+)   Anesthesia Other Findings   Reproductive/Obstetrics Pelvic pain                            Anesthesia Physical Anesthesia Plan  ASA: II  Anesthesia Plan: General   Post-op Pain Management:    Induction: Intravenous  Airway Management Planned: LMA  Additional Equipment:   Intra-op Plan:   Post-operative Plan: Extubation in OR  Informed Consent: I have reviewed the patients History and Physical, chart, labs and discussed the procedure including the risks, benefits and alternatives for the proposed anesthesia with the patient or authorized representative who has indicated his/her understanding and acceptance.   Dental advisory given  Plan Discussed with: Anesthesiologist, CRNA and Surgeon  Anesthesia Plan Comments:         Anesthesia Quick Evaluation

## 2016-01-30 NOTE — Op Note (Signed)
Operative diagnosis: Chronic cystitis Postoperative diagnosis chronic cystitis Surgery: Cystoscopy and bladder hydrodistention and cystoscopy Surgeon: Dr. Lorin PicketScott Jeanet Lupe  The patient has the above diagnosis and consented above procedure. A 23 French cystoscope was utilized. Preoperative antibiotics were given. Extra care was taken with leg positioning  The bladder was cystoscoped to a volume of 900 mL. The bladder mucosa and trigone were normal. There is no cystitis  The bladder was emptied and on reinspection she had no glomerulations or findings in keeping with interstitial cystitis. The urethral examination was also normal  As a separate procedure I instilled 25 mL of 0.5% Marcaine +400 mg of peridium  The patient may not have interstitial cystitis. She will be followed as per protocol. She does have a lot of urethral symptoms

## 2016-02-02 ENCOUNTER — Encounter (HOSPITAL_BASED_OUTPATIENT_CLINIC_OR_DEPARTMENT_OTHER): Payer: Self-pay | Admitting: Urology

## 2016-02-02 LAB — POCT HEMOGLOBIN-HEMACUE: Hemoglobin: 14 g/dL (ref 12.0–15.0)

## 2016-02-05 ENCOUNTER — Encounter: Payer: Self-pay | Admitting: Gastroenterology

## 2016-02-20 DIAGNOSIS — R351 Nocturia: Secondary | ICD-10-CM | POA: Diagnosis not present

## 2016-02-20 DIAGNOSIS — N3020 Other chronic cystitis without hematuria: Secondary | ICD-10-CM | POA: Diagnosis not present

## 2016-04-12 DIAGNOSIS — N946 Dysmenorrhea, unspecified: Secondary | ICD-10-CM | POA: Diagnosis not present

## 2016-04-12 DIAGNOSIS — Z975 Presence of (intrauterine) contraceptive device: Secondary | ICD-10-CM | POA: Diagnosis not present

## 2016-05-12 ENCOUNTER — Other Ambulatory Visit: Payer: Self-pay | Admitting: Family Medicine

## 2016-07-21 IMAGING — MR MR FOOT*R* W/O CM
4 of 6 series · 19 of 40 positions shown · non-contrast
Comparison: None.

CLINICAL DATA: Right plantar foot pain for 1 year.

EXAM:
MRI OF THE RIGHT FOREFOOT WITHOUT CONTRAST
TECHNIQUE: Multiplanar, multisequence MR imaging was performed. No intravenous
contrast was administered.

[Series 3: T1 · coronal · 4.0mm · 0.29mm/px · 8 of 36 slices shown (1 of 3)]
[im 1/36]
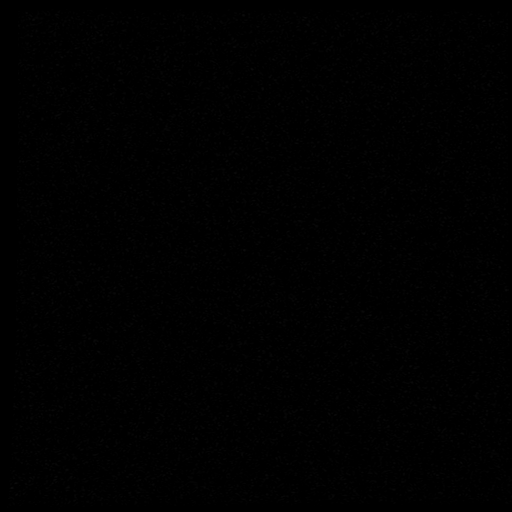
[im 4/36]
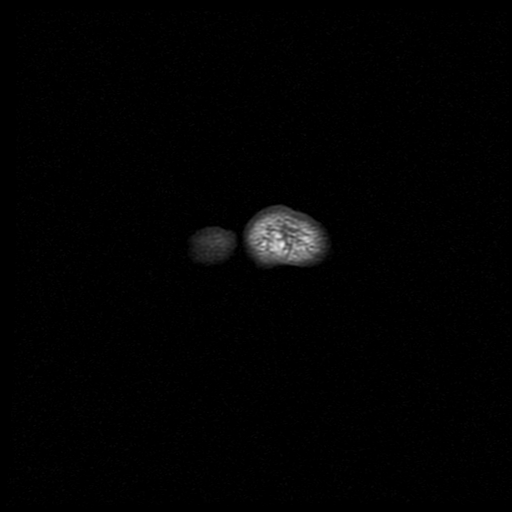
[im 12/36]
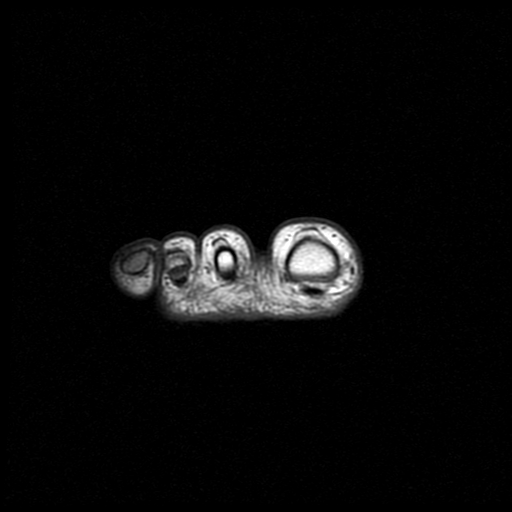
[im 16/36]
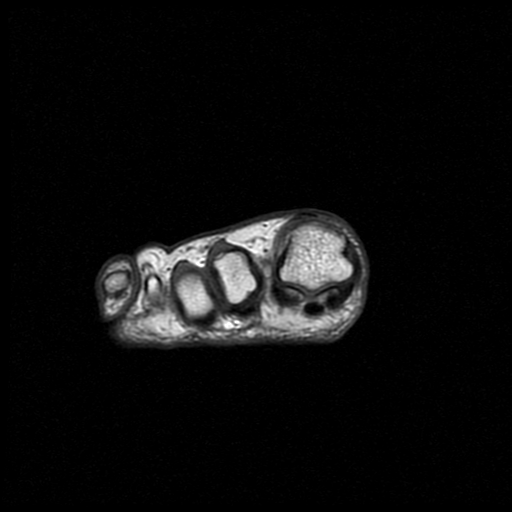
[im 20/36]
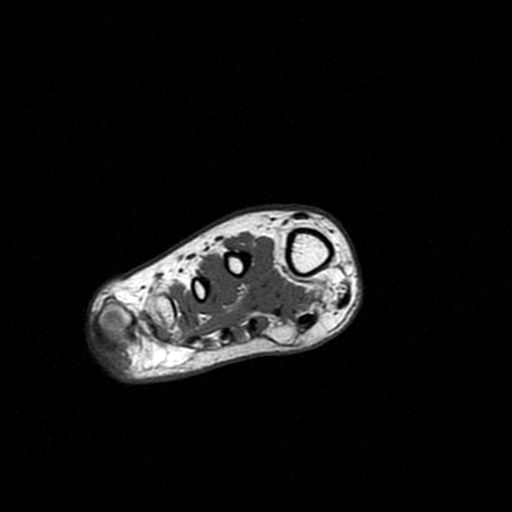
[im 24/36]
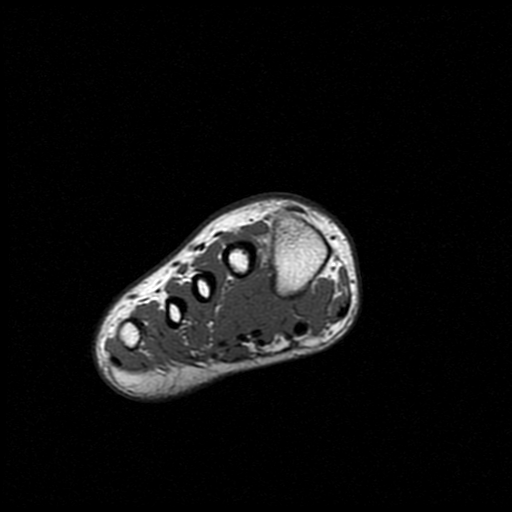
[im 32/36]
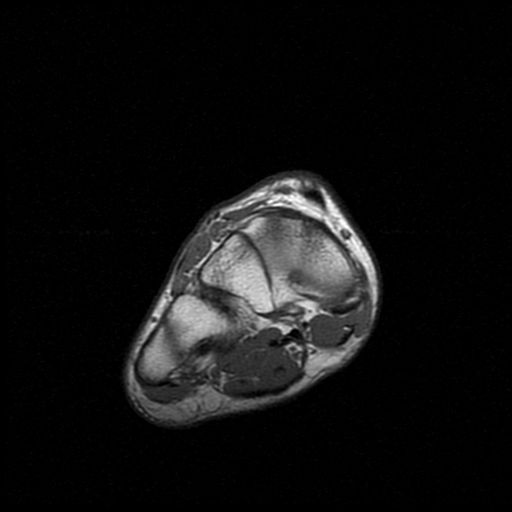
[im 36/36]
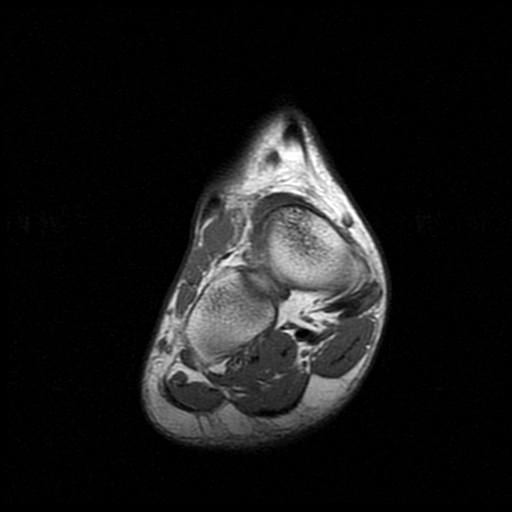

[Series 4: T2 · coronal · 4.0mm · 0.29mm/px · 3 of 36 slices shown]
[im 4/36]
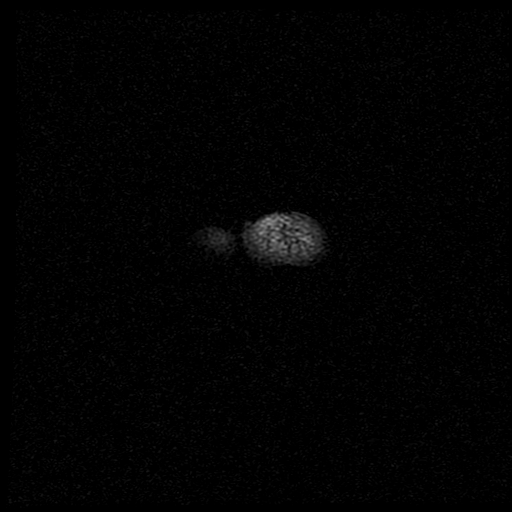
[im 20/36]
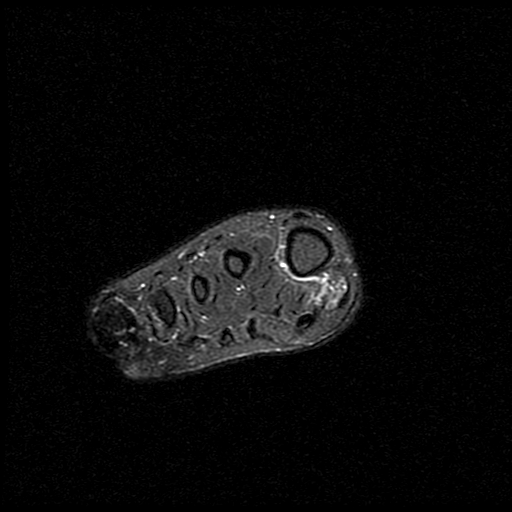
[im 32/36]
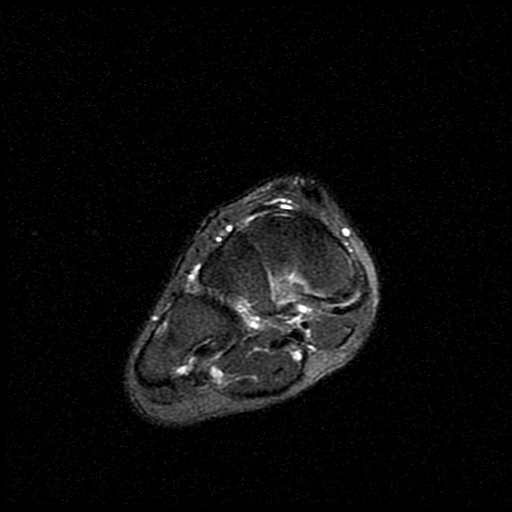

[Series 5: T1 · axial · 4.0mm · 0.33mm/px · z∈[-127,-43]mm · 5 of 18 slices shown (2 of 3)]
[im 1/18]
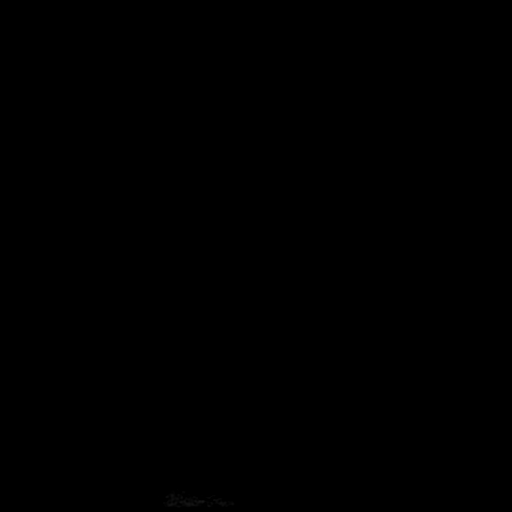
[im 5/18]
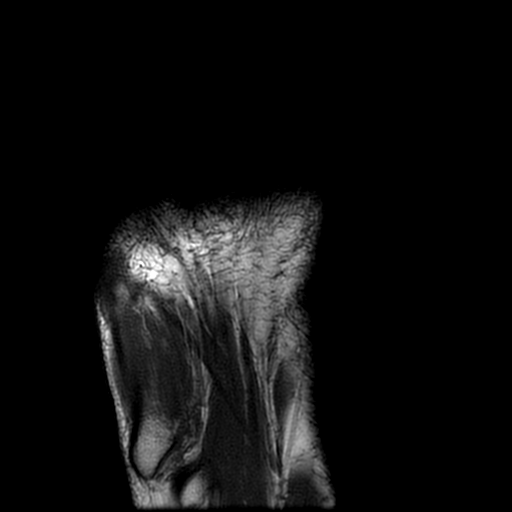
[im 9/18]
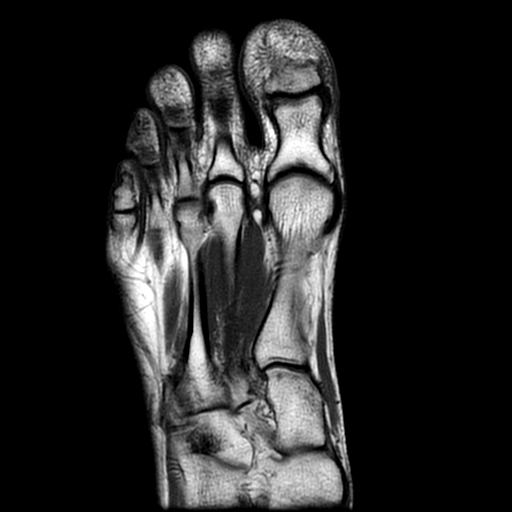
[im 13/18]
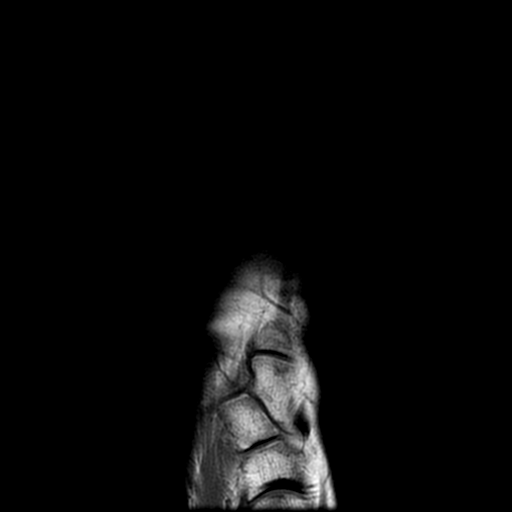
[im 18/18]
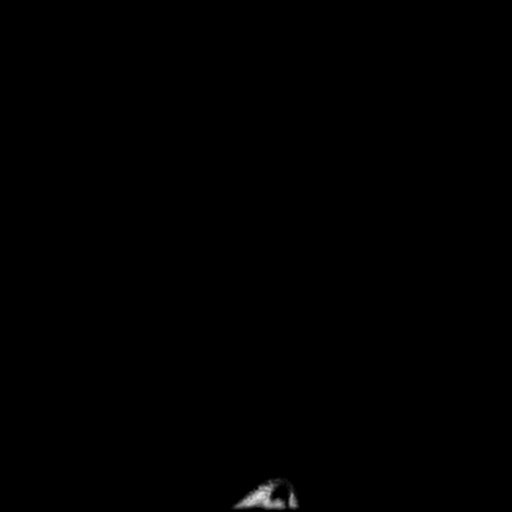

[Series 9: T1 · sagittal · 4.0mm · 0.33mm/px · 3 of 20 slices shown (3 of 3)]
[im 1/20]
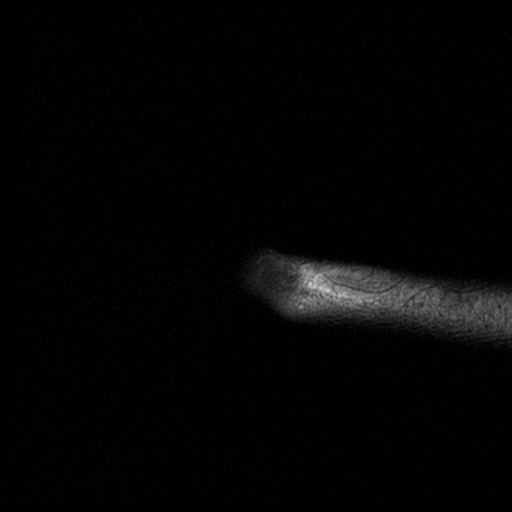
[im 10/20]
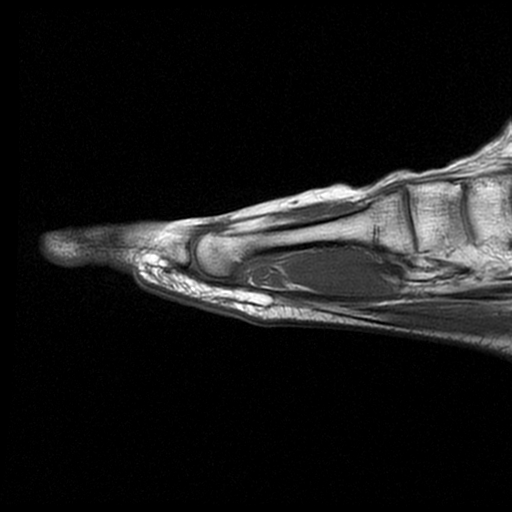
[im 20/20]
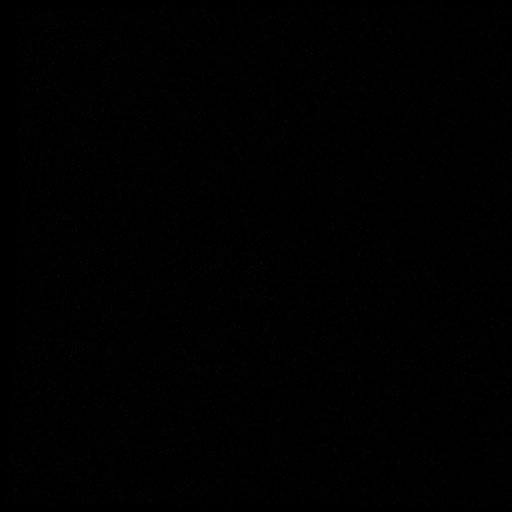

[19 of 40 positions shown; findings below may reference images not displayed]

FINDINGS: The scan demonstrates edema throughout much of the distal abductor
hallucis muscle along the plantar medial aspect of the first
metatarsal. This could be due to a strain and less likely
denervation. The

The bones of the forefoot and midfoot appear normal except for a
small intraosseous ganglion cyst in the base of the third metatarsal
which is not felt to be significant.

No other soft tissue abnormalities.
IMPRESSION: Abnormal edema in the abductor hallucis muscle, most likely a
strain. Is the patient a runner?

## 2017-02-01 ENCOUNTER — Telehealth: Payer: Self-pay | Admitting: Nurse Practitioner

## 2017-02-01 DIAGNOSIS — N3000 Acute cystitis without hematuria: Secondary | ICD-10-CM

## 2017-02-01 MED ORDER — NITROFURANTOIN MONOHYD MACRO 100 MG PO CAPS: 100.0000 mg | ORAL_CAPSULE | Freq: Two times a day (BID) | ORAL | 0 refills | Status: AC

## 2017-02-01 NOTE — Progress Notes (Signed)

## 2017-12-28 ENCOUNTER — Telehealth: Payer: Self-pay | Admitting: Family

## 2017-12-28 DIAGNOSIS — R399 Unspecified symptoms and signs involving the genitourinary system: Secondary | ICD-10-CM

## 2017-12-28 MED ORDER — CEPHALEXIN 500 MG PO CAPS: 500.0000 mg | ORAL_CAPSULE | Freq: Two times a day (BID) | ORAL | 0 refills | Status: AC

## 2017-12-28 NOTE — Progress Notes (Signed)
